# Patient Record
Sex: Male | Born: 1945 | Race: Black or African American | Hispanic: No | Marital: Married | Smoking: Never smoker
Health system: Southern US, Community
[De-identification: ages and names within clinical notes are randomized; demographics above are authoritative.]

## PROBLEM LIST (undated history)

## (undated) DIAGNOSIS — C801 Malignant (primary) neoplasm, unspecified: Secondary | ICD-10-CM

## (undated) DIAGNOSIS — R972 Elevated prostate specific antigen [PSA]: Secondary | ICD-10-CM

## (undated) DIAGNOSIS — E7800 Pure hypercholesterolemia, unspecified: Secondary | ICD-10-CM

## (undated) DIAGNOSIS — I10 Essential (primary) hypertension: Secondary | ICD-10-CM

## (undated) HISTORY — PX: PROSTATE BIOPSY: SHX241

---

## 1999-05-02 ENCOUNTER — Encounter: Admission: RE | Admit: 1999-05-02 | Discharge: 1999-05-02 | Payer: Self-pay | Admitting: *Deleted

## 1999-05-02 ENCOUNTER — Encounter: Payer: Self-pay | Admitting: *Deleted

## 1999-05-12 ENCOUNTER — Encounter (INDEPENDENT_AMBULATORY_CARE_PROVIDER_SITE_OTHER): Payer: Self-pay | Admitting: Specialist

## 1999-05-12 ENCOUNTER — Ambulatory Visit (HOSPITAL_COMMUNITY): Admission: RE | Admit: 1999-05-12 | Discharge: 1999-05-12 | Payer: Self-pay | Admitting: Gastroenterology

## 2001-10-14 ENCOUNTER — Ambulatory Visit (HOSPITAL_COMMUNITY): Admission: RE | Admit: 2001-10-14 | Discharge: 2001-10-14 | Payer: Self-pay | Admitting: Gastroenterology

## 2001-10-14 ENCOUNTER — Encounter (INDEPENDENT_AMBULATORY_CARE_PROVIDER_SITE_OTHER): Payer: Self-pay | Admitting: Specialist

## 2002-08-26 ENCOUNTER — Emergency Department (HOSPITAL_COMMUNITY): Admission: AD | Admit: 2002-08-26 | Discharge: 2002-08-26 | Payer: Self-pay | Admitting: Emergency Medicine

## 2004-02-01 ENCOUNTER — Encounter (INDEPENDENT_AMBULATORY_CARE_PROVIDER_SITE_OTHER): Payer: Self-pay | Admitting: *Deleted

## 2004-02-01 ENCOUNTER — Ambulatory Visit (HOSPITAL_COMMUNITY): Admission: RE | Admit: 2004-02-01 | Discharge: 2004-02-01 | Payer: Self-pay | Admitting: Gastroenterology

## 2006-12-14 ENCOUNTER — Emergency Department (HOSPITAL_COMMUNITY): Admission: EM | Admit: 2006-12-14 | Discharge: 2006-12-14 | Payer: Self-pay | Admitting: Emergency Medicine

## 2009-07-27 ENCOUNTER — Emergency Department (HOSPITAL_COMMUNITY): Admission: EM | Admit: 2009-07-27 | Discharge: 2009-07-27 | Payer: Self-pay | Admitting: Family Medicine

## 2010-06-02 NOTE — Op Note (Signed)
NAME:  Chase Cook, Chase Cook                         ACCOUNT NO.:  [id number]   MEDICAL RECORD NO.:  [id number]                   PATIENT TYPE:  AMB   LOCATION:  ENDO                                 FACILITY:  [facility]   PHYSICIAN:  Charna Elizabeth, M.D.                   DATE OF BIRTH:  04-29-1945   DATE OF PROCEDURE:  10/14/2001  DATE OF DISCHARGE:                                 OPERATIVE REPORT   PROCEDURE PERFORMED:  Colonoscopy with snare polypectomy times eight.   ENDOSCOPIST:  Charna Elizabeth, M.D.   INSTRUMENT USED:  Olympus video colonoscope.   INDICATIONS FOR PROCEDURE:  The patient is a 66 year old African-American  male undergoing colonoscopy for guaiac positive stool.  Rule out colonic  polyps, masses, hemorrhoids, etc.   PREPROCEDURE PREPARATION:  Informed consent was procured from the patient.  The patient was fasted for eight hours prior to the procedure and prepped  with a bottle of magnesium citrate and a gallon of NuLytely the night prior  to the procedure.   PREPROCEDURE PHYSICAL:  The patient had stable vital signs. Neck supple.  Chest clear to auscultation.  S1 and S2 regular.  Abdomen soft with normal  bowel sounds.   DESCRIPTION OF PROCEDURE:  The patient was placed in left lateral decubitus  position and sedated with 70 mg of Demerol and 7 mg of Versed intravenously.  Once the patient was adequately sedated and maintained on low flow oxygen  and continuous cardiac monitoring, the Olympus video colonoscope was  advanced from the rectum to the cecum without difficulty.  Multiple polyps  were seen in the colon, a 5 to 6 mm sessile polyp was snared from the  proximal right colon.  A 45 mm sessile polyp was snared from the  midtransverse colon.  An isolated nonbleeding AVM was seen in the proximal  transverse colon.  Six small sessile polyps were snared from the left colon.  Small internal hemorrhoids were seen on retroflexion.  There was no evidence  of  diverticulosis.   IMPRESSION:  1. Multiple colonic polyps.  See description above.  2. 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  3. Small nonbleeding internal hemorrhoid.  4. No evidence of diverticulosis.    RECOMMENDATIONS:  1. Avoid all nonsteroidals including aspirin for the next four weeks.  2. Await pathology results.  3. Outpatient follow-up in the next 7 to 10 days or earlier if need be.                                                   Charna Elizabeth, M.D.    JM/MEDQ  D:  10/14/2001  T:  10/14/2001  Job:  [id number]  cc:   Merlene Laughter. Renae Gloss, M.D.

## 2010-06-02 NOTE — Procedures (Signed)
[facility]  Patient:    Chase Cook, Chase Cook                      MRN: [id number] Proc. Date: 05/12/99 Adm. Date:  [id number] Disc. Date: [id number] Attending:  Deneen Harts CC:         Georg Ruddle. Viviann Spare, M.D.                           Procedure Report  PROCEDURE PERFORMED:  Colonoscopic polypectomy.  ENDOSCOPIST:  Griffith Citron, M.D.  INDICATIONS FOR PROCEDURE:  The patient is a 65 year old African-American male undergoing colonoscopy for polypectomy.  The patient examined with sigmoidoscopy April 22, 1998 at which time a colonoscopic polypectomy was advised.  The patient returns one year later to complete this recommendation.  He has had no interim difficulty.  DESCRIPTION OF PROCEDURE:  After the nature of the procedure was discussed with the patient including potential risks and complications and after discussing alternative methods of diagnosis and treatment, informed consent was signed.  The patient was premedicated, receiving IV sedation totalling Versed 6 mg, fentanyl 50 mcg administered in divided doses prior to the onset of the procedure.  Using the Olympus PCF-140L pediatric video colonoscope, rectum was intubated after normal digital examination.  No evidence of perianal or intrarectal pathology.  Normal smooth prostate.  The scope was easily advanced around the entire length of the colon to the cecum identified by the appendiceal orifice, ileocecal valve.  Preparation was good throughout.  The cecum was closely inspected and appeared normal.  In the proximal ascending  colon a diminutive 5 mm polyp was identified, resected with hot biopsy forceps recovered and submitted to pathology.  An additional 10 diminutive polyps were identified scattered around the entire length of the colon from ascending colon to the rectum.  Each benign, resected with hot biopsy forceps, recovered and submitted to pathology.  No additional  abnormalities were noted.  Specifically, without evidence of diverticular disease, mucosal inflammation, vascular lesion or other finding. Retroflex view in rectal vault did reveal internal hemorrhoids which were noninflamed.  The colon was decompressed, scope withdrawn.  The patient tolerated the procedure without difficulty being maintained on Datascope monitor and low-flow oxygen throughout.  Time:  2, technical 2, preparation 2, total score = 6.  ASSESSMENT: 1. Multiple colon polyps, all diminutive, benign-appearing.  All specimens    resected and submitted to pathology.  RECOMMENDATIONS: 1. Postpolypectomy instructions reviewed. 2. Follow-up pathology. 3. Annual hemoccult. 4. Repeat colonoscopy 3-year if adenomas, 5-year if hyperplastic.          DD:  05/15/99 TD:  05/16/99 Job: [id number]

## 2010-10-24 LAB — URINALYSIS, ROUTINE W REFLEX MICROSCOPIC
Bilirubin Urine: NEGATIVE
Glucose, UA: NEGATIVE
Ketones, ur: NEGATIVE
Nitrite: POSITIVE — AB
Protein, ur: 100 — AB
Specific Gravity, Urine: 1.025
Urobilinogen, UA: 1
pH: 5.5

## 2010-10-24 LAB — URINE MICROSCOPIC-ADD ON

## 2010-10-24 LAB — CBC
HCT: 38.7 — ABNORMAL LOW
Hemoglobin: 13
MCHC: 33.6
MCV: 85.9
Platelets: 173
RBC: 4.51
RDW: 13.1
WBC: 6.9

## 2010-10-24 LAB — I-STAT 8, (EC8 V) (CONVERTED LAB)
Acid-Base Excess: 2
BUN: 15
Bicarbonate: 27.7 — ABNORMAL HIGH
Chloride: 108
Glucose, Bld: 102 — ABNORMAL HIGH
HCT: 43
Hemoglobin: 14.6
Operator id: 294521
Potassium: 3.6
Sodium: 144
TCO2: 29
pCO2, Ven: 47.4
pH, Ven: 7.375 — ABNORMAL HIGH

## 2010-10-24 LAB — DIFFERENTIAL
Basophils Absolute: 0.1
Basophils Relative: 1
Eosinophils Absolute: 0.2
Eosinophils Relative: 3
Lymphocytes Relative: 19
Lymphs Abs: 1.3
Monocytes Absolute: 0.6
Monocytes Relative: 8
Neutro Abs: 4.8
Neutrophils Relative %: 69

## 2010-10-24 LAB — POCT I-STAT CREATININE
Creatinine, Ser: 1.1
Operator id: 294521

## 2010-10-24 LAB — URINE CULTURE: Colony Count: >=100000

## 2011-02-10 ENCOUNTER — Encounter (HOSPITAL_COMMUNITY): Payer: Self-pay

## 2011-02-10 ENCOUNTER — Emergency Department (INDEPENDENT_AMBULATORY_CARE_PROVIDER_SITE_OTHER)
Admission: EM | Admit: 2011-02-10 | Discharge: 2011-02-10 | Disposition: A | Discharge status: Home / Self Care | Payer: Self-pay | Source: Home / Self Care | Attending: Emergency Medicine | Admitting: Emergency Medicine

## 2011-02-10 DIAGNOSIS — S139XXA Sprain of joints and ligaments of unspecified parts of neck, initial encounter: Secondary | ICD-10-CM

## 2011-02-10 DIAGNOSIS — S161XXA Strain of muscle, fascia and tendon at neck level, initial encounter: Secondary | ICD-10-CM

## 2011-02-10 HISTORY — DX: Essential (primary) hypertension: I10

## 2011-02-10 MED ORDER — CYCLOBENZAPRINE HCL 5 MG PO TABS: 5.0000 mg | ORAL_TABLET | Freq: Three times a day (TID) | ORAL | Status: AC | PRN

## 2011-02-10 MED ORDER — DICLOFENAC SODIUM 75 MG PO TBEC: 75.0000 mg | DELAYED_RELEASE_TABLET | Freq: Two times a day (BID) | ORAL | Status: AC
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
## 2012-03-23 ENCOUNTER — Encounter (HOSPITAL_COMMUNITY): Payer: Self-pay | Admitting: Emergency Medicine

## 2012-03-23 ENCOUNTER — Emergency Department (INDEPENDENT_AMBULATORY_CARE_PROVIDER_SITE_OTHER)
Admission: EM | Admit: 2012-03-23 | Discharge: 2012-03-23 | Disposition: A | Discharge status: Home / Self Care | Payer: Medicare Other | Source: Home / Self Care

## 2012-03-23 DIAGNOSIS — N390 Urinary tract infection, site not specified: Secondary | ICD-10-CM

## 2012-03-23 LAB — POCT URINALYSIS DIP (DEVICE)
Glucose, UA: NEGATIVE mg/dL
Ketones, ur: NEGATIVE mg/dL
Nitrite: NEGATIVE
Protein, ur: 30 mg/dL — AB
Specific Gravity, Urine: 1.02 (ref 1.005–1.030)
Urobilinogen, UA: 1 mg/dL (ref 0.0–1.0)
pH: 5.5 (ref 5.0–8.0)

## 2012-03-23 MED ORDER — IBUPROFEN 800 MG PO TABS
800.0000 mg | ORAL_TABLET | Freq: Once | ORAL | Status: AC
  Administered 2012-03-23: 800 mg via ORAL

## 2012-03-23 MED ORDER — CEFTRIAXONE SODIUM 1 G IJ SOLR
1.0000 g | Freq: Once | INTRAMUSCULAR | Status: AC
  Administered 2012-03-23: 1 g via INTRAMUSCULAR

## 2012-03-23 MED ORDER — LIDOCAINE HCL (PF) 1 % IJ SOLN
INTRAMUSCULAR | Status: AC
  Filled 2012-03-23: qty 5

## 2012-03-23 MED ORDER — CEFTRIAXONE SODIUM 1 G IJ SOLR
INTRAMUSCULAR | Status: AC
  Filled 2012-03-23: qty 10

## 2012-03-23 MED ORDER — IBUPROFEN 800 MG PO TABS
ORAL_TABLET | ORAL | Status: AC
  Filled 2012-03-23: qty 1

## 2012-03-23 MED ORDER — LEVOFLOXACIN 250 MG PO TABS: 250.0000 mg | ORAL_TABLET | Freq: Every day | ORAL | Status: DC

## 2012-03-23 NOTE — ED Notes (Signed)
Pt has also stated that he has had a decrease in appetite and is very fatigue "I just want to sleep"

## 2012-03-23 NOTE — ED Notes (Signed)
Pt c/o fever since Thursday. Pt states temp has reached 105. Pt has been using tylenol for fever. C/o burning with urination when temp is elevated. Denies lower back pain and urinary symptoms.  Slight cough nonproductive.

## 2012-03-23 NOTE — ED Provider Notes (Signed)
History     CSN: [id number]  Arrival date & time 03/23/12  1337   First MD Initiated Contact with Patient 03/23/12 1344      Chief Complaint  Patient presents with  . Fever    fever since thursday. temp has reached 105    (Consider location/radiation/quality/duration/timing/severity/associated sxs/prior treatment) Patient is a 67 y.o. male presenting with fever.  Fever Associated symptoms: dysuria    This is a 67 year old male who presents for a complaint of fevers as high as 103 for about 4 days. He noticed some burning when he has to urinate but no pus or blood noted in the urine. No suprapubic pain or flank pain. The last time he had a urinary tract infection was about 3 or 4 years ago. He states that he has seen Dr. Brunilda Payor in the past and does not have any problems with his prostate. No complaints of nausea or vomiting.  Past Medical History  Diagnosis Date  . Hypertension     History reviewed. No pertinent past surgical history.  History reviewed. No pertinent family history.  History  Substance Use Topics  . Smoking status: Never Smoker   . Smokeless tobacco: Not on file  . Alcohol Use: No      Review of Systems  Constitutional: Positive for fever. Negative for appetite change.  HENT: Negative.   Eyes: Negative.   Respiratory: Negative.   Gastrointestinal: Negative.   Endocrine: Negative.   Genitourinary: Positive for dysuria, urgency, frequency and enuresis. Negative for hematuria, flank pain, decreased urine volume, discharge and penile pain.  Musculoskeletal: Negative.   Skin: Negative.   Neurological: Negative.   Hematological: Negative.   Psychiatric/Behavioral: Negative.     Allergies  Review of patient's allergies indicates no known allergies.  Home Medications   Current Outpatient Rx  Name  Route  Sig  Dispense  Refill  . amLODipine (NORVASC) 10 MG tablet   Oral   Take 10 mg by mouth daily.         Marland Kitchen levofloxacin (LEVAQUIN) 250 MG  tablet   Oral   Take 1 tablet (250 mg total) by mouth daily.   7 tablet   0     BP 148/88  Pulse 89  Temp(Src) 103 F (39.4 C) (Oral)  Resp 20  SpO2 96%  Physical Exam  ED Course  Procedures (including critical care time)  Labs Reviewed  POCT URINALYSIS DIP (DEVICE) - Abnormal; Notable for the following:    Bilirubin Urine SMALL (*)    Hgb urine dipstick MODERATE (*)    Protein, ur 30 (*)    Leukocytes, UA MODERATE (*)    All other components within normal limits  URINE CULTURE   No results found.   1. UTI (lower urinary tract infection)       MDM  The patient had a UTI in 2008 which was Escherichia coli and pan sensitive. I have ordered IM Rocephin to be given here followed by a 7 day course of Levaquin at 250 mg daily. He is advised to continue Tylenol or ibuprofen for fevers. If symptoms do not resolve by the end of the course of antibiotics he is advised to see Dr. Brunilda Payor in the office.        Calvert Cantor, MD 03/23/12 [phone]

## 2012-03-25 LAB — URINE CULTURE
Colony Count: >=100000
Special Requests: NORMAL

## 2012-03-25 NOTE — ED Notes (Signed)
Urine culture: >100,000 colonies E. Coli.  Pt. adequately treated with Levaquin. Vassie Moselle 03/25/2012

## 2014-05-20 LAB — HM COLONOSCOPY

## 2014-05-28 ENCOUNTER — Encounter: Payer: Self-pay | Admitting: Family Medicine

## 2014-06-30 ENCOUNTER — Emergency Department (HOSPITAL_COMMUNITY)
Admission: EM | Admit: 2014-06-30 | Discharge: 2014-07-01 | Disposition: A | Discharge status: Home / Self Care | Payer: Medicare Other | Attending: Emergency Medicine | Admitting: Emergency Medicine

## 2014-06-30 ENCOUNTER — Encounter (HOSPITAL_COMMUNITY): Payer: Self-pay | Admitting: Emergency Medicine

## 2014-06-30 DIAGNOSIS — R6883 Chills (without fever): Secondary | ICD-10-CM | POA: Insufficient documentation

## 2014-06-30 DIAGNOSIS — Z79899 Other long term (current) drug therapy: Secondary | ICD-10-CM | POA: Insufficient documentation

## 2014-06-30 DIAGNOSIS — N390 Urinary tract infection, site not specified: Secondary | ICD-10-CM | POA: Insufficient documentation

## 2014-06-30 DIAGNOSIS — I10 Essential (primary) hypertension: Secondary | ICD-10-CM | POA: Diagnosis not present

## 2014-06-30 DIAGNOSIS — R300 Dysuria: Secondary | ICD-10-CM | POA: Diagnosis present

## 2014-06-30 LAB — CBC WITH DIFFERENTIAL/PLATELET
Basophils Absolute: 0 10*3/uL (ref 0.0–0.1)
Basophils Relative: 0 % (ref 0–1)
Eosinophils Absolute: 0 10*3/uL (ref 0.0–0.7)
Eosinophils Relative: 0 % (ref 0–5)
HCT: 39 % (ref 39.0–52.0)
Hemoglobin: 13 g/dL (ref 13.0–17.0)
Lymphocytes Relative: 13 % (ref 12–46)
Lymphs Abs: 2.4 10*3/uL (ref 0.7–4.0)
MCH: 29.1 pg (ref 26.0–34.0)
MCHC: 33.3 g/dL (ref 30.0–36.0)
MCV: 87.2 fL (ref 78.0–100.0)
Monocytes Absolute: 1.9 10*3/uL — ABNORMAL HIGH (ref 0.1–1.0)
Monocytes Relative: 10 % (ref 3–12)
Neutro Abs: 13.8 10*3/uL — ABNORMAL HIGH (ref 1.7–7.7)
Neutrophils Relative %: 77 % (ref 43–77)
Platelets: 159 10*3/uL (ref 150–400)
RBC: 4.47 MIL/uL (ref 4.22–5.81)
RDW: 14.2 % (ref 11.5–15.5)
WBC: 18.1 10*3/uL — ABNORMAL HIGH (ref 4.0–10.5)

## 2014-06-30 LAB — URINALYSIS, ROUTINE W REFLEX MICROSCOPIC
Glucose, UA: NEGATIVE mg/dL
Ketones, ur: 15 mg/dL — AB
Nitrite: NEGATIVE
Protein, ur: 100 mg/dL — AB
Specific Gravity, Urine: 1.031 — ABNORMAL HIGH (ref 1.005–1.030)
Urobilinogen, UA: 1 mg/dL (ref 0.0–1.0)
pH: 5 (ref 5.0–8.0)

## 2014-06-30 LAB — COMPREHENSIVE METABOLIC PANEL
ALT: 20 U/L (ref 17–63)
AST: 20 U/L (ref 15–41)
Albumin: 3.6 g/dL (ref 3.5–5.0)
Alkaline Phosphatase: 60 U/L (ref 38–126)
Anion gap: 8 (ref 5–15)
BUN: 14 mg/dL (ref 6–20)
CO2: 25 mmol/L (ref 22–32)
Calcium: 8.6 mg/dL — ABNORMAL LOW (ref 8.9–10.3)
Chloride: 107 mmol/L (ref 101–111)
Creatinine, Ser: 1.22 mg/dL (ref 0.61–1.24)
GFR calc Af Amer: >60 mL/min (ref >60)
GFR calc non Af Amer: 59 mL/min — ABNORMAL LOW (ref >60)
Glucose, Bld: 109 mg/dL — ABNORMAL HIGH (ref 65–99)
Potassium: 3.3 mmol/L — ABNORMAL LOW (ref 3.5–5.1)
Sodium: 140 mmol/L (ref 135–145)
Total Bilirubin: 0.8 mg/dL (ref 0.3–1.2)
Total Protein: 6.7 g/dL (ref 6.5–8.1)

## 2014-06-30 LAB — URINE MICROSCOPIC-ADD ON

## 2014-06-30 MED ORDER — ACETAMINOPHEN 325 MG PO TABS
ORAL_TABLET | ORAL | Status: AC
  Filled 2014-06-30: qty 2

## 2014-06-30 MED ORDER — ACETAMINOPHEN 325 MG PO TABS
650.0000 mg | ORAL_TABLET | Freq: Once | ORAL | Status: AC
  Administered 2014-06-30: 650 mg via ORAL

## 2014-06-30 NOTE — ED Provider Notes (Signed)
CSN: [id number]     Arrival date & time 06/30/14  2052 History  This chart was scribed for Marisa Severin, MD by Annye Asa, ED Scribe. This patient was seen in room D34C/D34C and the patient's care was started at 12:07 AM.    Chief Complaint  Patient presents with  . Dysuria   The history is provided by the patient. No language interpreter was used.     HPI Comments: Chase Cook is a 69 y.o. male who presents to the Emergency Department complaining of 1 day of dysuria, chills, and weakness. Patient states he has prior experience with similar symptoms; last episode in 2008. He denies nausea, vomiting, diarrhea, back pain. He denies a personal history of kidney stones, prostate issues, or DM.  Past Medical History  Diagnosis Date  . Hypertension    History reviewed. No pertinent past surgical history. No family history on file. History  Substance Use Topics  . Smoking status: Never Smoker   . Smokeless tobacco: Not on file  . Alcohol Use: No    Review of Systems  Constitutional: Positive for chills.  Gastrointestinal: Negative for nausea, vomiting and diarrhea.  Genitourinary: Positive for dysuria.  Musculoskeletal: Negative for back pain.  All other systems reviewed and are negative.   Allergies  Review of patient's allergies indicates no known allergies.  Home Medications   Prior to Admission medications   Medication Sig Start Date End Date Taking? Authorizing Provider  amLODipine (NORVASC) 10 MG tablet Take 10 mg by mouth daily.    Historical Provider, MD  levofloxacin (LEVAQUIN) 250 MG tablet Take 1 tablet (250 mg total) by mouth daily. 03/23/12   Calvert Cantor, MD   BP 107/65 mmHg  Pulse 73  Temp(Src) 98.4 F (36.9 C) (Oral)  Resp 18  Ht  (1.676 m)  Wt 214 lb (97.07 kg)  BMI 34.56 kg/m2  SpO2 96% Physical Exam  Constitutional: He is oriented to person, place, and time. He appears well-developed and well-nourished. No distress.  HENT:  Head: Normocephalic  and atraumatic.  Eyes: EOM are normal. Pupils are equal, round, and reactive to light.  Neck: Normal range of motion. Neck supple. No JVD present.  Cardiovascular: Normal rate, regular rhythm and normal heart sounds.  Exam reveals no gallop and no friction rub.   No murmur heard. Pulmonary/Chest: Effort normal and breath sounds normal. No respiratory distress. He has no wheezes. He has no rales.  Abdominal: Soft. Bowel sounds are normal. He exhibits no mass. There is no tenderness. There is no rebound and no guarding.  Musculoskeletal: Normal range of motion. He exhibits no edema or tenderness.  No CVA tenderness  Neurological: He is alert and oriented to person, place, and time. He displays normal reflexes.  Skin: Skin is warm and dry. No rash noted.  Psychiatric: He has a normal mood and affect. His behavior is normal.  Nursing note and vitals reviewed.   ED Course  Procedures   DIAGNOSTIC STUDIES: Oxygen Saturation is 96% on RA, adequate by my interpretation.    COORDINATION OF CARE: 12:09 AM Discussed treatment plan with patient and family at bedside and patient agreed to plan.  Labs Review Labs Reviewed  CBC WITH DIFFERENTIAL/PLATELET - Abnormal; Notable for the following:    WBC 18.1 (*)    Neutro Abs 13.8 (*)    Monocytes Absolute 1.9 (*)    All other components within normal limits  COMPREHENSIVE METABOLIC PANEL - Abnormal; Notable for the following:  Potassium 3.3 (*)    Glucose, Bld 109 (*)    Calcium 8.6 (*)    GFR calc non Af Amer 59 (*)    All other components within normal limits  URINALYSIS, ROUTINE W REFLEX MICROSCOPIC (NOT AT [facility]) - Abnormal; Notable for the following:    Color, Urine RED (*)    APPearance TURBID (*)    Specific Gravity, Urine 1.031 (*)    Hgb urine dipstick LARGE (*)    Bilirubin Urine SMALL (*)    Ketones, ur 15 (*)    Protein, ur 100 (*)    Leukocytes, UA LARGE (*)    All other components within normal limits  URINE  MICROSCOPIC-ADD ON - Abnormal; Notable for the following:    Bacteria, UA MANY (*)    All other components within normal limits  URINE CULTURE    Imaging Review No results found.   EKG Interpretation None      MDM   Final diagnoses:  UTI (lower urinary tract infection)    I personally performed the services described in this documentation, which was scribed in my presence. The recorded information has been reviewed and is accurate.  69 year old male with one day of dysuria, low-grade fever, chills.  Has history of urinary tract infections.  Denies history of prostatitis, BPH, kidney stones or other urinary symptom dysfunction.  Patient denies any nausea or vomiting.  Leukocytosis noted, but patient not clinically ill-appearing Patient was given precautions for return.  Will start on antibiotics.  We'll send urine for culture.     Marisa Severin, MD 07/01/14 [phone]

## 2014-06-30 NOTE — ED Notes (Signed)
Pt. reports dysuria " it burns " with concentrated urine onset today , fever , denies injury or back pain .

## 2014-07-01 DIAGNOSIS — N390 Urinary tract infection, site not specified: Secondary | ICD-10-CM | POA: Diagnosis not present

## 2014-07-01 MED ORDER — CIPROFLOXACIN HCL 500 MG PO TABS
500.0000 mg | ORAL_TABLET | Freq: Once | ORAL | Status: AC
  Administered 2014-07-01: 500 mg via ORAL
  Filled 2014-07-01: qty 1

## 2014-07-01 MED ORDER — CIPROFLOXACIN HCL 500 MG PO TABS: 500.0000 mg | ORAL_TABLET | Freq: Two times a day (BID) | ORAL | Status: DC

## 2014-07-01 NOTE — ED Notes (Signed)
Provider bedside.

## 2014-07-01 NOTE — Discharge Instructions (Signed)
Return to the emergency department for persistent fevers, nausea, vomiting, change in mental status, or other new concerning symptoms.  Make sure that you take all antibiotics until they are gone.  Drink plenty of fluids.    Urinary Tract Infection Urinary tract infections (UTIs) can develop anywhere along your urinary tract. Your urinary tract is your body's drainage system for removing wastes and extra water. Your urinary tract includes two kidneys, two ureters, a bladder, and a urethra. Your kidneys are a pair of bean-shaped organs. Each kidney is about the size of your fist. They are located below your ribs, one on each side of your spine. CAUSES Infections are caused by microbes, which are microscopic organisms, including fungi, viruses, and bacteria. These organisms are so small that they can only be seen through a microscope. Bacteria are the microbes that most commonly cause UTIs. SYMPTOMS  Symptoms of UTIs may vary by age and gender of the patient and by the location of the infection. Symptoms in young women typically include a frequent and intense urge to urinate and a painful, burning feeling in the bladder or urethra during urination. Older women and men are more likely to be tired, shaky, and weak and have muscle aches and abdominal pain. A fever may mean the infection is in your kidneys. Other symptoms of a kidney infection include pain in your back or sides below the ribs, nausea, and vomiting. DIAGNOSIS To diagnose a UTI, your caregiver will ask you about your symptoms. Your caregiver also will ask to provide a urine sample. The urine sample will be tested for bacteria and white blood cells. White blood cells are made by your body to help fight infection. TREATMENT  Typically, UTIs can be treated with medication. Because most UTIs are caused by a bacterial infection, they usually can be treated with the use of antibiotics. The choice of antibiotic and length of treatment depend on your  symptoms and the type of bacteria causing your infection. HOME CARE INSTRUCTIONS  If you were prescribed antibiotics, take them exactly as your caregiver instructs you. Finish the medication even if you feel better after you have only taken some of the medication.  Drink enough water and fluids to keep your urine clear or pale yellow.  Avoid caffeine, tea, and carbonated beverages. They tend to irritate your bladder.  Empty your bladder often. Avoid holding urine for long periods of time.  Empty your bladder before and after sexual intercourse.  After a bowel movement, women should cleanse from front to back. Use each tissue only once. SEEK MEDICAL CARE IF:   You have back pain.  You develop a fever.  Your symptoms do not begin to resolve within 3 days. SEEK IMMEDIATE MEDICAL CARE IF:   You have severe back pain or lower abdominal pain.  You develop chills.  You have nausea or vomiting.  You have continued burning or discomfort with urination. MAKE SURE YOU:   Understand these instructions.  Will watch your condition.  Will get help right away if you are not doing well or get worse. Document Released: 10/11/2004 Document Revised: 07/03/2011 Document Reviewed: 02/09/2011 [facility] Patient Information 2015 [place], [place]. This information is not intended to replace advice given to you by your health care provider. Make sure you discuss any questions you have with your health care provider.

## 2014-07-01 NOTE — ED Notes (Addendum)
Pt reports that he experienced chills and weakness yesterday.  No n/v or diaharria.

## 2014-07-03 LAB — URINE CULTURE: Culture: >=100000

## 2014-07-05 ENCOUNTER — Telehealth (HOSPITAL_COMMUNITY): Payer: Self-pay

## 2014-07-05 NOTE — Telephone Encounter (Signed)
Post ED Visit - Positive Culture Follow-up  Culture report reviewed by antimicrobial stewardship pharmacist: []  Wes Dulaney, Pharm.D., BCPS []  Celedonio Miyamoto, [address].D., BCPS [x]  Georgina Pillion, Pharm.D., BCPS []  [place], [place].D., BCPS, AAHIVP []  Estella Husk, Pharm.D., BCPS, AAHIVP []  Elder Cyphers, [address].D., BCPS  Positive Urine culture, >/= 100,000 colonies -> Proteus Mirabilis Treated with Ciprofloxacin, organism sensitive to the same and no further patient follow-up is required at this time.Arvid Right 07/05/2014, 4:48 AM

## 2017-07-17 ENCOUNTER — Ambulatory Visit (HOSPITAL_COMMUNITY)
Admission: EM | Admit: 2017-07-17 | Discharge: 2017-07-17 | Disposition: A | Discharge status: Home / Self Care | Payer: Medicare Other | Attending: Family Medicine | Admitting: Family Medicine

## 2017-07-17 ENCOUNTER — Other Ambulatory Visit: Payer: Self-pay

## 2017-07-17 ENCOUNTER — Encounter (HOSPITAL_COMMUNITY): Payer: Self-pay | Admitting: Emergency Medicine

## 2017-07-17 DIAGNOSIS — R590 Localized enlarged lymph nodes: Secondary | ICD-10-CM

## 2017-07-17 HISTORY — DX: Pure hypercholesterolemia, unspecified: E78.00

## 2017-07-17 NOTE — ED Provider Notes (Signed)
[facility]    CSN: [id number] Arrival date & time: 07/17/17  1124     History   Chief Complaint Chief Complaint  Patient presents with  . Cyst    HPI Chase Cook is a 72 y.o. male with recent upper respiratory infection symptoms presenting with 2 days of discrete swelling under the right jaw. He had sore throat, and cough a couple weeks ago which has been resolving. No fever recently, and noted a swelling on the right neck 2 days ago in the afternoon that was mildly tender and has since decreased in size and is no longer tender. He denies any pain or difficulty with swallowing, but can feel it sometimes when swallowing. He's had no halitosis, no shortness of breath, stridor, neck stiffness. No other spots that he knows of and no medications directed at this.  HPI  Past Medical History:  Diagnosis Date  . High cholesterol   . Hypertension     There are no active problems to display for this patient.   History reviewed. No pertinent surgical history.     Home Medications    Prior to Admission medications   Medication Sig Start Date End Date Taking? Authorizing Provider  atorvastatin (LIPITOR) 10 MG tablet Take 10 mg by mouth daily.   Yes [provider]  potassium chloride (K-DUR) 10 MEQ tablet Take 10 mEq by mouth daily.   Yes [provider]  tamsulosin (FLOMAX) 0.4 MG CAPS capsule Take 0.4 mg by mouth.   Yes [provider]  triamterene-hydrochlorothiazide (DYAZIDE) 37.5-25 MG capsule Take 1 capsule by mouth daily.   Yes [provider]  amLODipine (NORVASC) 10 MG tablet Take 10 mg by mouth daily.    [provider]    Family History Family History  Problem Relation Age of Onset  . Diabetes Brother     Social History Social History   Tobacco Use  . Smoking status: Never Smoker  Substance Use Topics  . Alcohol use: No  . Drug use: No     Allergies   Patient has no known allergies.   Review  of Systems Review of Systems Per HPI. Denies fever, chills, weight loss, changes in vision or hearing, headache, current sore throat, chest pain, palpitations, shortness of breath, abdominal pain, nausea, vomiting, changes in bowel habits, blood in stool, change in bladder habits, myalgias, arthralgias, and rash.    Physical Exam Triage Vital Signs ED Triage Vitals  Enc Vitals Group     BP 07/17/17 1318 (!) 159/76     Pulse Rate 07/17/17 1318 68     Resp 07/17/17 1318 16     Temp 07/17/17 1318 97.9 F (36.6 C)     Temp Source 07/17/17 1318 Oral     SpO2 07/17/17 1318 99 %     Weight --      Height --      Head Circumference --      Peak Flow --      Pain Score 07/17/17 1252 0     Pain Loc --      Pain Edu? --      Excl. in [place]? --    No data found.  Updated Vital Signs BP (!) 159/76 (BP Location: Right Arm)   Pulse 68   Temp 97.9 F (36.6 C) (Oral)   Resp 16   SpO2 99%   Visual Acuity Right Eye Distance:   Left Eye Distance:   Bilateral Distance:  Right Eye Near:   Left Eye Near:    Bilateral Near:     Physical Exam BP (!) 159/76 (BP Location: Right Arm)   Pulse 68   Temp 97.9 F (36.6 C) (Oral)   Resp 16   SpO2 99%  Gen: Pleasant, well-appearing 71 y.o.male in NAD HEENT: MMM, posterior oropharynx clear without erythema or exudate. Gold crowns on bottom left molars. Neck: Oblong nontender ~1.5cm solid deep nodular structure palpable without visible erythema overlaying. No bruit to this area and no significant movement with swallowing. No surrounding nodules or contralateral lymphadenopathy. No occipital lymphadenopathy. Pulm: Non-labored; CTAB, no wheezes or stridor CV: Regular rate, no murmur appreciated; distal pulses intact/symmetric GI: + BS; soft, non-tender, non-distended Skin: No rashes, wounds, ulcers Neuro: A&Ox3, CN II-XII without deficits     UC Treatments / Results  Labs (all labs ordered are listed, but only abnormal results are  displayed) Labs Reviewed - No data to display  EKG None  Radiology No results found.  Procedures Procedures (including critical care time)  Medications Ordered in UC Medications - No data to display  Initial Impression / Assessment and Plan / UC Course  I have reviewed the triage vital signs and the nursing notes.  Pertinent labs & imaging results that were available during my care of the patient were reviewed by me and considered in my medical decision making (see chart for details).    72yo M with 2 days of modular growth consistent with reactive anterior cervical lymph node that is now decreasing in size, no longer tender. Recommended observation and return if still present in 2 weeks at which time further investigation would be warranted. DDx includes malignancy, zenker diverticulum, cyst, and abscess though these are felt much less likely.  Final Clinical Impressions(s) / UC Diagnoses   Final diagnoses:  Reactive cervical lymphadenopathy     Discharge Instructions     You were evaluated for what I believe is a lymph node that got bigger after an infection has resolved. It is expected to continue improving on its own without any other management. If it is still there in 2 weeks, seek medical attention for further work up. If it gets larger and you have difficulty swallowing or difficulty breathing, seek medical attention immediately.    ED Prescriptions    None     Controlled Substance Prescriptions [place] Controlled Substance Registry consulted? Not Applicable   Tyrone NineGrunz, Xavior Niazi B, MD 07/17/17 1401

## 2017-07-17 NOTE — ED Notes (Signed)
Patient access reported a visitor approached desk asking about patient and if had been called.  Patient was called multiple times.  Patient has been un-discharged and returned to patient list

## 2017-07-17 NOTE — ED Notes (Signed)
Called pt x 2

## 2017-07-17 NOTE — ED Triage Notes (Signed)
Knot under right side of chin since Monday.  Initially seemed more sore than now.  Patient has a slight cough.  Initially felt like he had a "Bransyn horse " in the area, pain eased, but knot remained.  Feels this with swallowing

## 2017-07-17 NOTE — Discharge Instructions (Signed)
You were evaluated for what I believe is a lymph node that got bigger after an infection has resolved. It is expected to continue improving on its own without any other management. If it is still there in 2 weeks, seek medical attention for further work up. If it gets larger and you have difficulty swallowing or difficulty breathing, seek medical attention immediately.

## 2018-08-28 ENCOUNTER — Ambulatory Visit (HOSPITAL_COMMUNITY)
Admission: EM | Admit: 2018-08-28 | Discharge: 2018-08-28 | Disposition: A | Discharge status: Home / Self Care | Payer: Medicare Other | Attending: Family Medicine | Admitting: Family Medicine

## 2018-08-28 ENCOUNTER — Encounter (HOSPITAL_COMMUNITY): Payer: Self-pay | Admitting: Emergency Medicine

## 2018-08-28 ENCOUNTER — Other Ambulatory Visit: Payer: Self-pay

## 2018-08-28 DIAGNOSIS — R6884 Jaw pain: Secondary | ICD-10-CM

## 2018-08-28 DIAGNOSIS — K047 Periapical abscess without sinus: Secondary | ICD-10-CM | POA: Diagnosis not present

## 2018-08-28 DIAGNOSIS — R590 Localized enlarged lymph nodes: Secondary | ICD-10-CM

## 2018-08-28 MED ORDER — PENICILLIN V POTASSIUM 500 MG PO TABS: 500.0000 mg | ORAL_TABLET | Freq: Four times a day (QID) | ORAL | 0 refills | Status: AC

## 2018-08-28 NOTE — ED Triage Notes (Signed)
Pt here for right sided jaw pain and swelling x 2 days

## 2018-08-28 NOTE — ED Provider Notes (Signed)
[facility]    CSN: [id number] Arrival date & time: 08/28/18  1003      History   Chief Complaint Chief Complaint  Patient presents with  . Jaw Pain    HPI Su LeyCharlie R Kimmer is a 73 y.o. male.   HPI  Patient states he has right-sided jaw pain and swelling for 2 days.  He states that he does not have anything wrong with his teeth.  He states that he has a swollen gland on the side.  He states that it is mildly tender.  He states that this is swelled up before when he had sore throat.  No fever or chills.  No pain with chewing.  No known dental fracture or problem.  Past Medical History:  Diagnosis Date  . High cholesterol   . Hypertension     There are no active problems to display for this patient.   History reviewed. No pertinent surgical history.     Home Medications    Prior to Admission medications   Medication Sig Start Date End Date Taking? Authorizing Provider  amLODipine (NORVASC) 10 MG tablet Take 10 mg by mouth daily.    [provider]  atorvastatin (LIPITOR) 10 MG tablet Take 10 mg by mouth daily.    [provider]  penicillin v potassium (VEETID) 500 MG tablet Take 1 tablet (500 mg total) by mouth 4 (four) times daily for 10 days. 08/28/18 09/07/18  Eustace MooreNelson, Erlinda Solinger Sue, MD  potassium chloride (K-DUR) 10 MEQ tablet Take 10 mEq by mouth daily.    [provider]  tamsulosin (FLOMAX) 0.4 MG CAPS capsule Take 0.4 mg by mouth.    [provider]  triamterene-hydrochlorothiazide (DYAZIDE) 37.5-25 MG capsule Take 1 capsule by mouth daily.    [provider]    Family History Family History  Problem Relation Age of Onset  . Diabetes Brother     Social History Social History   Tobacco Use  . Smoking status: Never Smoker  Substance Use Topics  . Alcohol use: No  . Drug use: No     Allergies   Patient has no known allergies.    Review of Systems Review of Systems  Constitutional: Negative for  chills and fever.  HENT: Positive for dental problem. Negative for ear pain and sore throat.   Eyes: Negative for pain and visual disturbance.  Respiratory: Negative for cough and shortness of breath.   Cardiovascular: Negative for chest pain and palpitations.  Gastrointestinal: Negative for abdominal pain and vomiting.  Genitourinary: Negative for dysuria and hematuria.  Musculoskeletal: Negative for arthralgias and back pain.  Skin: Negative for color change and rash.  Neurological: Negative for seizures and syncope.  Hematological: Positive for adenopathy.  All other systems reviewed and are negative.    Physical Exam Triage Vital Signs ED Triage Vitals  Enc Vitals Group     BP 08/28/18 1103 (!) 177/95     Pulse Rate 08/28/18 1103 79     Resp 08/28/18 1103 18     Temp 08/28/18 1103 98.1 F (36.7 C)     Temp Source 08/28/18 1103 Oral     SpO2 08/28/18 1103 98 %     Weight --      Height --      Head Circumference --      Peak Flow --      Pain Score 08/28/18 1104 5     Pain Loc --      Pain  Edu? --      Excl. in [place]? --    No data found.  Updated Vital Signs BP (!) 177/95 (BP Location: Right Arm)   Pulse 79   Temp 98.1 F (36.7 C) (Oral)   Resp 18   SpO2 98%      Physical Exam Constitutional:      General: He is not in acute distress.    Appearance: He is well-developed. He is obese.     Comments: Patient's no distress.  Pleasant cooperative  HENT:     Head: Normocephalic and atraumatic.      Right Ear: Tympanic membrane and ear canal normal.     Left Ear: Tympanic membrane normal.     Nose: Nose normal.     Mouth/Throat:     Mouth: Mucous membranes are moist.     Pharynx: No posterior oropharyngeal erythema.   Eyes:     Conjunctiva/sclera: Conjunctivae normal.     Pupils: Pupils are equal, round, and reactive to light.  Neck:     Musculoskeletal: Normal range of motion.  Cardiovascular:     Rate and Rhythm: Normal rate.  Pulmonary:     Effort:  Pulmonary effort is normal. No respiratory distress.  Abdominal:     General: There is no distension.     Palpations: Abdomen is soft.  Musculoskeletal: Normal range of motion.  Skin:    General: Skin is warm and dry.  Neurological:     Mental Status: He is alert.      UC Treatments / Results  Labs (all labs ordered are listed, but only abnormal results are displayed) Labs Reviewed - No data to display  EKG   Radiology No results found.  Procedures Procedures (including critical care time)  Medications Ordered in UC Medications - No data to display  Initial Impression / Assessment and Plan / UC Course  I have reviewed the triage vital signs and the nursing notes.  Pertinent labs & imaging results that were available during my care of the patient were reviewed by me and considered in my medical decision making (see chart for details).    Patient does have swelling of the gums adjacent to his molar that looks like a dental abscess.  This would explain swelling of the face, some reactive lymph nodes.  I am concerned that there was a large node described in the same area last year, and if this proves persistent it would require additional attention and possibly biopsy.  Patient is advised to follow-up with his PCP regarding the lymph node Final Clinical Impressions(s) / UC Diagnoses   Final diagnoses:  Dental infection  Anterior cervical adenopathy  Pain in lower jaw     Discharge Instructions     Take the antibiotic as directed Warm compresses to area Expect improvement over a few days See your PCP for persistent pain or swelling   ED Prescriptions    Medication Sig Dispense Auth. Provider   penicillin v potassium (VEETID) 500 MG tablet Take 1 tablet (500 mg total) by mouth 4 (four) times daily for 10 days. 40 tablet Raylene Everts, MD     Controlled Substance Prescriptions [place] Controlled Substance Registry consulted? Not Applicable   Raylene Everts,  MD 08/28/18 [phone]

## 2018-08-28 NOTE — Discharge Instructions (Addendum)
Take the antibiotic as directed Warm compresses to area Expect improvement over a few days See your PCP for persistent pain or swelling

## 2018-10-08 ENCOUNTER — Other Ambulatory Visit: Payer: Self-pay

## 2018-10-08 DIAGNOSIS — Z20822 Contact with and (suspected) exposure to covid-19: Secondary | ICD-10-CM

## 2018-10-10 LAB — NOVEL CORONAVIRUS, NAA: SARS-CoV-2, NAA: NOT DETECTED

## 2019-03-01 ENCOUNTER — Ambulatory Visit: Payer: Medicare Other

## 2019-05-19 ENCOUNTER — Encounter (HOSPITAL_COMMUNITY): Payer: Self-pay | Admitting: Emergency Medicine

## 2019-05-19 ENCOUNTER — Emergency Department (HOSPITAL_COMMUNITY)
Admission: EM | Admit: 2019-05-19 | Discharge: 2019-05-20 | Disposition: A | Discharge status: Home / Self Care | Payer: Medicare Other | Attending: Emergency Medicine | Admitting: Emergency Medicine

## 2019-05-19 ENCOUNTER — Other Ambulatory Visit: Payer: Self-pay

## 2019-05-19 DIAGNOSIS — Z7984 Long term (current) use of oral hypoglycemic drugs: Secondary | ICD-10-CM | POA: Diagnosis not present

## 2019-05-19 DIAGNOSIS — I10 Essential (primary) hypertension: Secondary | ICD-10-CM | POA: Diagnosis not present

## 2019-05-19 DIAGNOSIS — Y92003 Bedroom of unspecified non-institutional (private) residence as the place of occurrence of the external cause: Secondary | ICD-10-CM | POA: Insufficient documentation

## 2019-05-19 DIAGNOSIS — S50861A Insect bite (nonvenomous) of right forearm, initial encounter: Secondary | ICD-10-CM | POA: Diagnosis present

## 2019-05-19 DIAGNOSIS — Y939 Activity, unspecified: Secondary | ICD-10-CM | POA: Diagnosis not present

## 2019-05-19 DIAGNOSIS — W57XXXA Bitten or stung by nonvenomous insect and other nonvenomous arthropods, initial encounter: Secondary | ICD-10-CM | POA: Diagnosis not present

## 2019-05-19 DIAGNOSIS — E119 Type 2 diabetes mellitus without complications: Secondary | ICD-10-CM | POA: Insufficient documentation

## 2019-05-19 DIAGNOSIS — Z79899 Other long term (current) drug therapy: Secondary | ICD-10-CM | POA: Diagnosis not present

## 2019-05-19 DIAGNOSIS — Y999 Unspecified external cause status: Secondary | ICD-10-CM | POA: Diagnosis not present

## 2019-05-19 NOTE — ED Triage Notes (Signed)
Patient reports itchy tick bite at right lower axilla this evening . Denies fever/respirations unlabored.

## 2019-05-20 NOTE — ED Provider Notes (Signed)
[facility] EMERGENCY DEPARTMENT Provider Note   CSN: [id number] Arrival date & time: 05/19/19  2334     History Chief Complaint  Patient presents with  . Tick Bite    Chase Cook is a 74 y.o. male.  HPI     This is a 74 year old male with history of hypertension and hypercholesterolemia who presents with a tick bite.  Patient reports he noted going to bed tonight and itchy bump under his right arm.  His wife looked and there was a tick.  He has known a friend that died of [place] spotted fever so this concerned him.  He reports that the tick was removed in triage.  He has not had any fevers or noted any rashes.  Unsure of how long the tick has been there.  Besides being itchy it was not painful.  Past Medical History:  Diagnosis Date  . High cholesterol   . Hypertension     There are no problems to display for this patient.   History reviewed. No pertinent surgical history.     Family History  Problem Relation Age of Onset  . Diabetes Brother     Social History   Tobacco Use  . Smoking status: Never Smoker  . Smokeless tobacco: Never Used  Substance Use Topics  . Alcohol use: No  . Drug use: No    Home Medications Prior to Admission medications   Medication Sig Start Date End Date Taking? Authorizing Provider  amLODipine (NORVASC) 10 MG tablet Take 10 mg by mouth daily.    [provider]  atorvastatin (LIPITOR) 10 MG tablet Take 10 mg by mouth daily.    [provider]  potassium chloride (K-DUR) 10 MEQ tablet Take 10 mEq by mouth daily.    [provider]  tamsulosin (FLOMAX) 0.4 MG CAPS capsule Take 0.4 mg by mouth.    [provider]  triamterene-hydrochlorothiazide (DYAZIDE) 37.5-25 MG capsule Take 1 capsule by mouth daily.    [provider]    Allergies    Patient has no known allergies.  Review of Systems   Review of Systems  Constitutional: Negative for fever.  Skin:  Negative for rash.       Tick bite  All other systems reviewed and are negative.   Physical Exam Updated Vital Signs BP (!) 159/87 (BP Location: Left Arm)   Pulse 63   Temp 98.6 F (37 C) (Oral)   Resp 18   Ht 1.676 m (5\' 6" )   Wt (!) 155 kg   SpO2 99%   BMI 55.15 kg/m   Physical Exam Vitals and nursing note reviewed.  Constitutional:      Appearance: He is well-developed. He is obese. He is not ill-appearing.  HENT:     Head: Normocephalic and atraumatic.     Nose: Nose normal.     Mouth/Throat:     Mouth: Mucous membranes are moist.  Eyes:     Pupils: Pupils are equal, round, and reactive to light.  Cardiovascular:     Rate and Rhythm: Normal rate and regular rhythm.  Pulmonary:     Effort: Pulmonary effort is normal. No respiratory distress.  Skin:    General: Skin is warm and dry.     Comments: Punctate area just below the right axilla with slight erythema noted, there is a black center concerning for retained tick, no adjacent erythema or fluctuance noted  Neurological:     Mental Status: He  is alert and oriented to person, place, and time.  Psychiatric:        Mood and Affect: Mood normal.     ED Results / Procedures / Treatments   Labs (all labs ordered are listed, but only abnormal results are displayed) Labs Reviewed - No data to display  EKG None  Radiology No results found.  Procedures Procedures (including critical care time)  Medications Ordered in ED Medications - No data to display  ED Course  I have reviewed the triage vital signs and the nursing notes.  Pertinent labs & imaging results that were available during my care of the patient were reviewed by me and considered in my medical decision making (see chart for details).    MDM Rules/Calculators/A&P                       Patient presents with concern for tick bite.  Tick was removed in triage although on my exam, there was some concern for the head of the tick still being within  the bite.  I did remove a small black fleck with tweezers.  Otherwise there is no systemic symptoms or rash.  No indication for prophylaxis.  I did discuss with the patient that if he develops any new or worsening symptoms including rash or fever he should be reevaluated for tickborne illness.  After history, exam, and medical workup I feel the patient has been appropriately medically screened and is safe for discharge home. Pertinent diagnoses were discussed with the patient. Patient was given return precautions.   Final Clinical Impression(s) / ED Diagnoses Final diagnoses:  Tick bite, initial encounter    Rx / DC Orders ED Discharge Orders    None       Johnnisha Forton, Barbette Hair, MD 05/20/19 [phone]

## 2019-05-20 NOTE — Discharge Instructions (Addendum)
You were seen today and a tick was removed.  Monitor of the removal site.  If you notice any rash, skin changes, or fever you should be reevaluated.

## 2019-09-18 ENCOUNTER — Other Ambulatory Visit: Payer: Medicare Other

## 2019-09-18 ENCOUNTER — Other Ambulatory Visit: Payer: Self-pay

## 2019-09-18 DIAGNOSIS — Z20822 Contact with and (suspected) exposure to covid-19: Secondary | ICD-10-CM

## 2019-09-19 LAB — NOVEL CORONAVIRUS, NAA: SARS-CoV-2, NAA: NOT DETECTED

## 2019-10-26 ENCOUNTER — Ambulatory Visit: Payer: Medicare Other | Attending: Internal Medicine

## 2019-10-26 DIAGNOSIS — Z23 Encounter for immunization: Secondary | ICD-10-CM

## 2019-10-26 NOTE — Progress Notes (Signed)
   Covid-19 Vaccination Clinic  Name:  Chase Cook    MRN: [id number] DOB: [birth year]  10/26/2019  Chase Cook was observed post Covid-19 immunization for 15 minutes without incident. He was provided with Vaccine Information Sheet and instruction to access the V-Safe system.   Chase Cook was instructed to call [phone] with any severe reactions post vaccine: Marland Kitchen Difficulty breathing  . Swelling of face and throat  . A fast heartbeat  . A bad rash all over body  . Dizziness and weakness

## 2020-07-16 ENCOUNTER — Encounter (HOSPITAL_COMMUNITY): Payer: Self-pay

## 2020-07-16 ENCOUNTER — Ambulatory Visit (HOSPITAL_COMMUNITY)
Admission: EM | Admit: 2020-07-16 | Discharge: 2020-07-16 | Disposition: A | Discharge status: Home / Self Care | Payer: Medicare Other | Attending: Family Medicine | Admitting: Family Medicine

## 2020-07-16 ENCOUNTER — Other Ambulatory Visit: Payer: Self-pay

## 2020-07-16 DIAGNOSIS — M5441 Lumbago with sciatica, right side: Secondary | ICD-10-CM | POA: Diagnosis not present

## 2020-07-16 DIAGNOSIS — M5442 Lumbago with sciatica, left side: Secondary | ICD-10-CM

## 2020-07-16 MED ORDER — PREDNISONE 10 MG (21) PO TBPK: ORAL_TABLET | Freq: Every day | ORAL | 0 refills | Status: AC

## 2020-07-16 MED ORDER — TIZANIDINE HCL 4 MG PO TABS: 4.0000 mg | ORAL_TABLET | Freq: Four times a day (QID) | ORAL | 0 refills | Status: DC | PRN

## 2020-07-16 NOTE — ED Provider Notes (Signed)
[facility]   [id number] 07/16/20 Arrival Time: 1047  EL:FYBOF PAIN  SUBJECTIVE: History from: patient. Chase Cook is a 75 y.o. male complains of right leg pain that began 7 days ago.  Denies a precipitating event or specific injury. Reports that he thinks that he pulled a muscled and states that he feels like the leg is "giving out" at times.  Describes the pain as constant and achy in character. Has tried OTC medications without relief. Symptoms are made worse with activity. Denies similar symptoms in the past. Denies fever, chills, erythema, ecchymosis, effusion, weakness, numbness and tingling, saddle paresthesias, loss of bowel or bladder function.      ROS: As per HPI.  All other pertinent ROS negative.     Past Medical History:  Diagnosis Date   High cholesterol    Hypertension    History reviewed. No pertinent surgical history. No Known Allergies No current facility-administered medications on file prior to encounter.   Current Outpatient Medications on File Prior to Encounter  Medication Sig Dispense Refill   amLODipine (NORVASC) 10 MG tablet Take 10 mg by mouth daily.     atorvastatin (LIPITOR) 10 MG tablet Take 10 mg by mouth daily.     potassium chloride (K-DUR) 10 MEQ tablet Take 10 mEq by mouth daily.     tamsulosin (FLOMAX) 0.4 MG CAPS capsule Take 0.4 mg by mouth.     triamterene-hydrochlorothiazide (DYAZIDE) 37.5-25 MG capsule Take 1 capsule by mouth daily.     Social History   Socioeconomic History   Marital status: Single    Spouse name: Not on file   Number of children: Not on file   Years of education: Not on file   Highest education level: Not on file  Occupational History   Not on file  Tobacco Use   Smoking status: Never   Smokeless tobacco: Never  Substance and Sexual Activity   Alcohol use: No   Drug use: No   Sexual activity: Not on file  Other Topics Concern   Not on file  Social History Narrative   Not on file   Social  Determinants of Health   Financial Resource Strain: Not on file  Food Insecurity: Not on file  Transportation Needs: Not on file  Physical Activity: Not on file  Stress: Not on file  Social Connections: Not on file  Intimate Partner Violence: Not on file   Family History  Problem Relation Age of Onset   Diabetes Brother     OBJECTIVE:  Vitals:   07/16/20 1220 07/16/20 1223  BP: (!) 165/83   Pulse: 63   Resp: 20   Temp:  98.1 F (36.7 C)  TempSrc:  Oral  SpO2: 99%     General appearance: ALERT; in no acute distress.  Head: NCAT Lungs: Normal respiratory effort CV: pulses 2+ bilaterally. Cap refill < 2 seconds Musculoskeletal:  Inspection: Skin warm, dry, clear and intact No erythema, effusion noted Palpation: low back tender to palpation ROM: Limited ROM active and passive with bending, twisting, changing positions Skin: warm and dry Neurologic: Ambulates without difficulty; Sensation intact about the upper/ lower extremities Psychological: alert and cooperative; normal mood and affect  DIAGNOSTIC STUDIES:  No results found.   ASSESSMENT & PLAN:  1. Acute bilateral low back pain with bilateral sciatica    Meds ordered this encounter  Medications   predniSONE (STERAPRED UNI-PAK 21 TAB) 10 MG (21) TBPK tablet    Sig: Take by mouth daily for  6 days. Take 6 tablets on day 1, 5 tablets on day 2, 4 tablets on day 3, 3 tablets on day 4, 2 tablets on day 5, 1 tablet on day 6    Dispense:  21 tablet    Refill:  0    Order Specific Question:   Supervising Provider    Answer:   Merrilee Jansky [[id number]]   tiZANidine (ZANAFLEX) 4 MG tablet    Sig: Take 1 tablet (4 mg total) by mouth every 6 (six) hours as needed for muscle spasms.    Dispense:  30 tablet    Refill:  0    Order Specific Question:   Supervising Provider    Answer:   Merrilee Jansky [id number]   Steroid course prescribed Tizanidine prescribed prn Sedation precautions given Continue conservative  management of rest, ice, and gentle stretches Take ibuprofen as needed for pain relief (may cause abdominal discomfort, ulcers, and GI bleeds avoid taking with other NSAIDs) Follow up with PCP if symptoms persist Return or go to the ER if you have any new or worsening symptoms (fever, chills, chest pain, abdominal pain, changes in bowel or bladder habits, pain radiating into lower legs)   Reviewed expectations re: course of current medical issues. Questions answered. Outlined signs and symptoms indicating need for more acute intervention. Patient verbalized understanding. After Visit Summary given.        Moshe Cipro, NP 07/16/20 1248

## 2020-07-16 NOTE — Discharge Instructions (Addendum)
I have sent in a prednisone taper for you to take for 6 days. 6 tablets on day one, 5 tablets on day two, 4 tablets on day three, 3 tablets on day four, 2 tablets on day five, and 1 tablet on day six.  I have sent in tizanidine for you to take up to 3 times per day as needed for muscle spasms  Follow up with this office or with primary care if symptoms are persisting.  Follow up in the ER for high fever, trouble swallowing, trouble breathing, other concerning symptoms.

## 2020-07-16 NOTE — ED Triage Notes (Signed)
Pt presents with right leg pain x 1 week.   States he might have pulled a muscle and states he feel the right leg is giving away.

## 2020-07-25 ENCOUNTER — Other Ambulatory Visit: Payer: Self-pay | Admitting: Sports Medicine

## 2020-07-25 ENCOUNTER — Ambulatory Visit
Admission: RE | Admit: 2020-07-25 | Discharge: 2020-07-25 | Disposition: A | Discharge status: Home / Self Care | Payer: Medicare Other | Source: Ambulatory Visit | Attending: Sports Medicine | Admitting: Sports Medicine

## 2020-07-25 ENCOUNTER — Other Ambulatory Visit: Payer: Self-pay

## 2020-07-25 DIAGNOSIS — R52 Pain, unspecified: Secondary | ICD-10-CM

## 2022-07-28 IMAGING — CR DG LUMBAR SPINE 2-3V
3 series · 3 of 3 positions shown · non-contrast
Comparison: None.

CLINICAL DATA: Pain.

EXAM:
LUMBAR SPINE - 2-3 VIEW

[w lumbar spine ap]
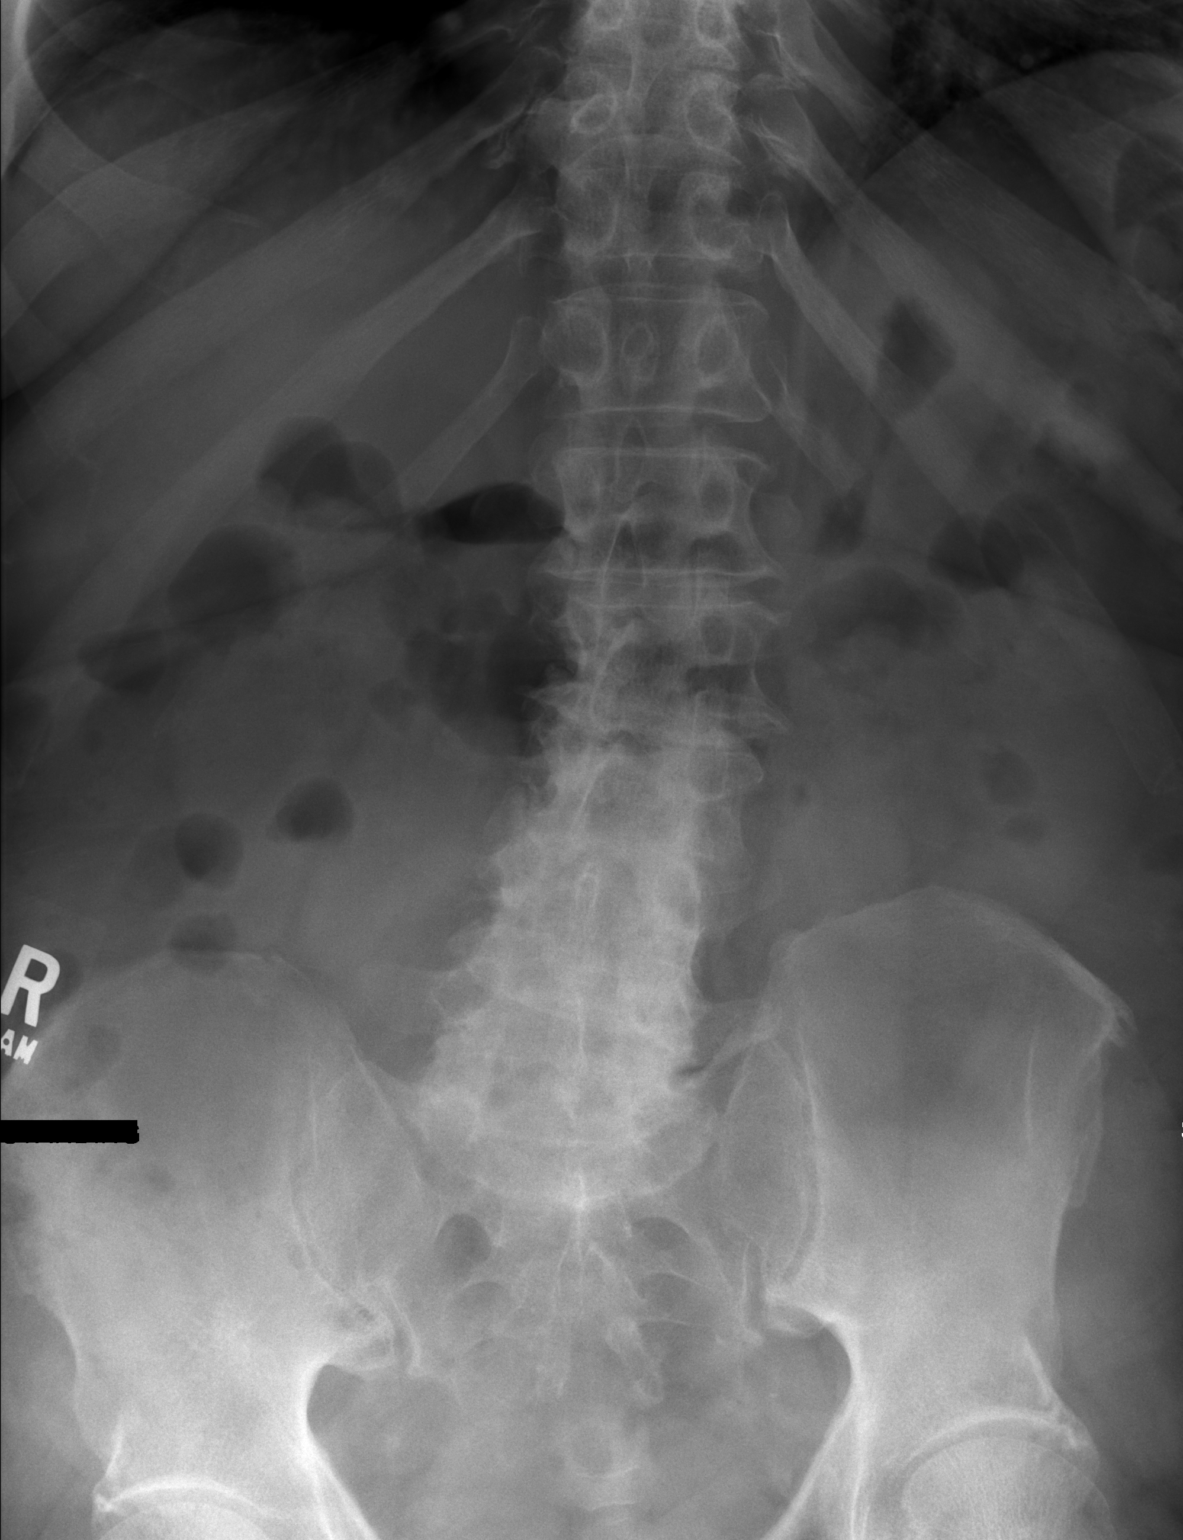

[w lumbar spine lat]
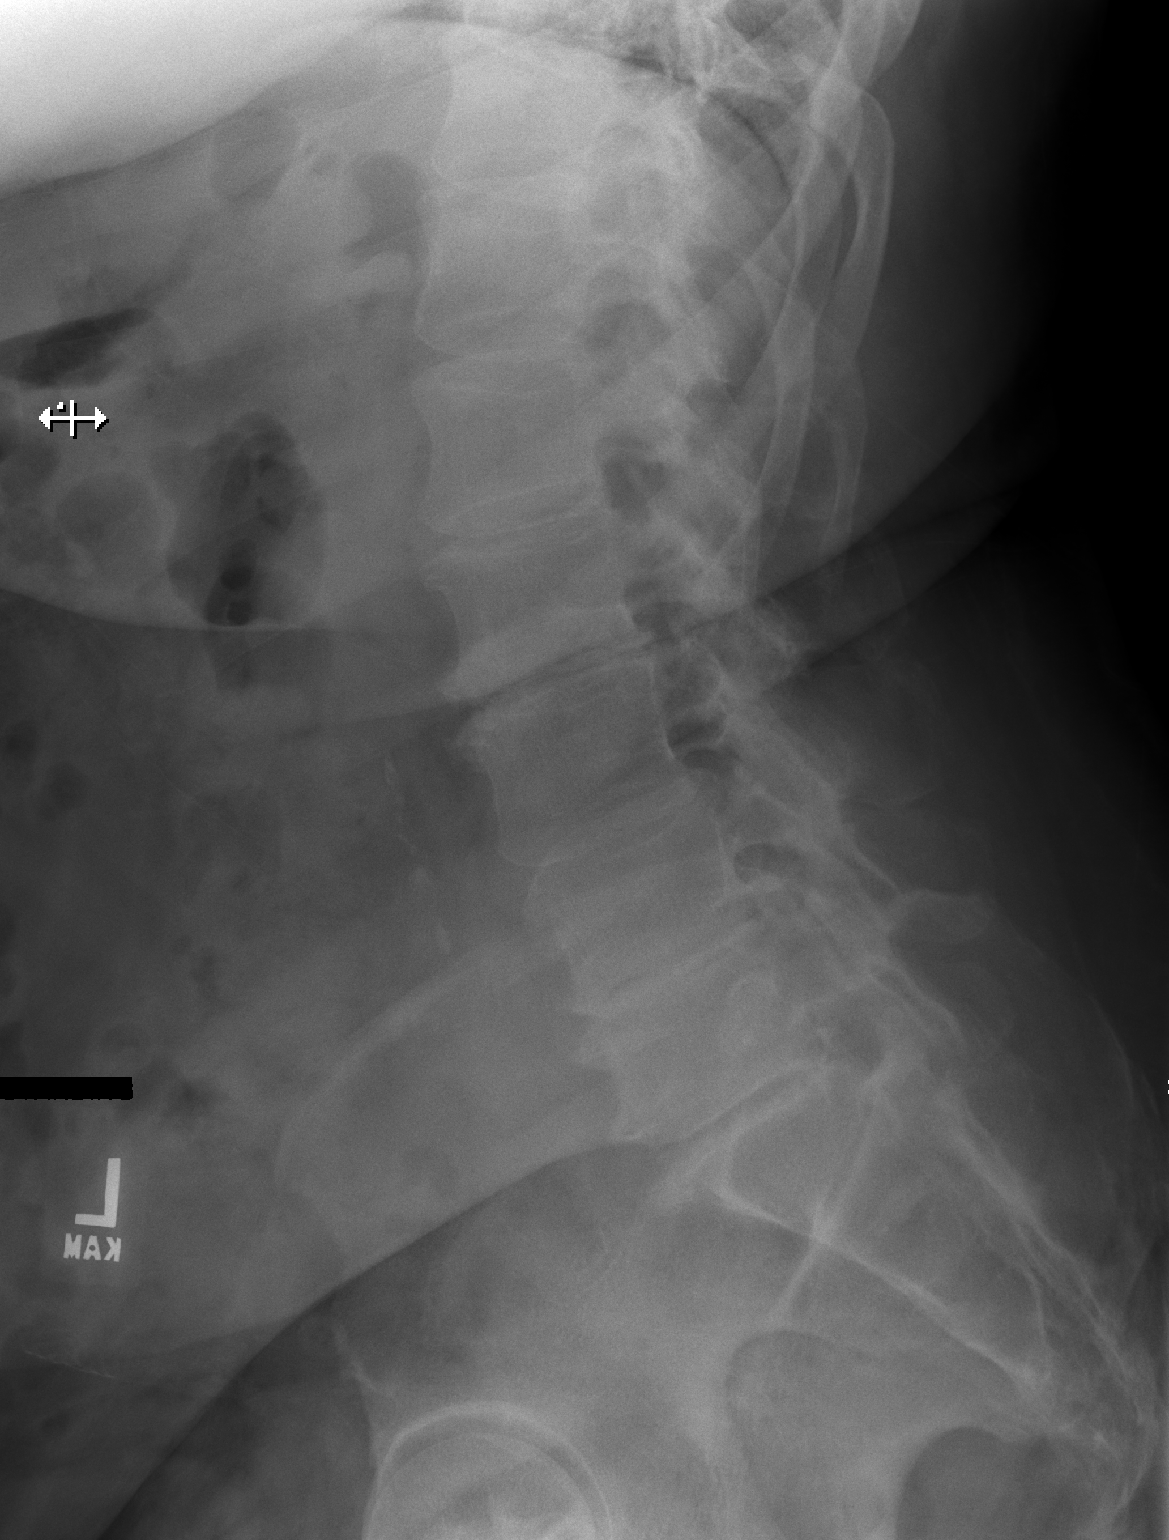

[w lumbar l-5 s-1 spot]
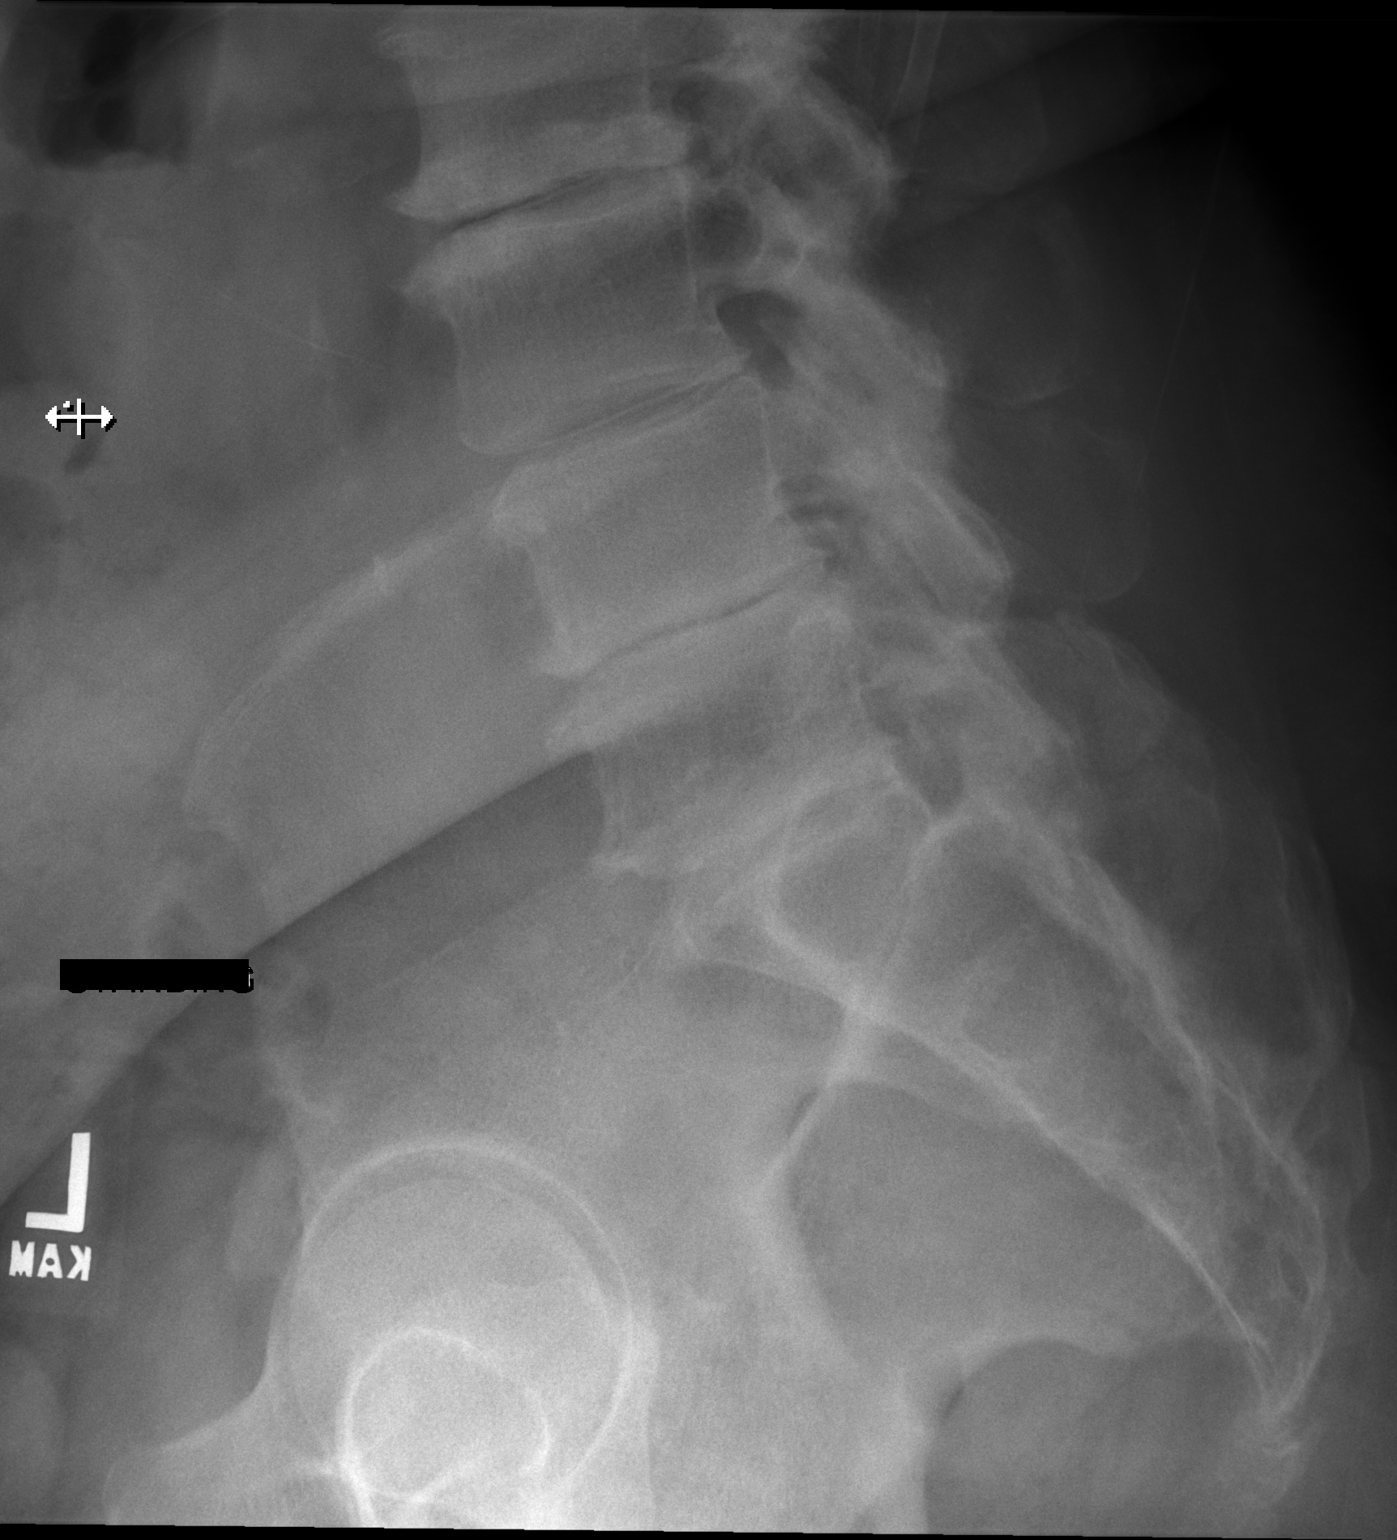

[3 of 3 positions shown; findings below may reference images not displayed]

FINDINGS: Mild curvature of the lumbar spine appears convex towards the left.
The vertebral body heights are well preserved. There is moderate
multilevel disc space narrowing and endplate spurring compatible
with degenerative disc disease. This is most advanced at L2-3 and
L5-S1. No fracture or dislocation identified.
IMPRESSION: 1. No acute findings.
2. Lumbar degenerative disc disease.

## 2023-01-04 ENCOUNTER — Other Ambulatory Visit (HOSPITAL_COMMUNITY): Payer: Self-pay | Admitting: Urology

## 2023-01-04 DIAGNOSIS — R972 Elevated prostate specific antigen [PSA]: Secondary | ICD-10-CM

## 2023-01-15 ENCOUNTER — Ambulatory Visit (HOSPITAL_COMMUNITY)
Admission: RE | Admit: 2023-01-15 | Discharge: 2023-01-15 | Disposition: A | Discharge status: Home / Self Care | Payer: Medicare Other | Source: Ambulatory Visit | Attending: Urology | Admitting: Urology

## 2023-01-15 DIAGNOSIS — R972 Elevated prostate specific antigen [PSA]: Secondary | ICD-10-CM | POA: Diagnosis present

## 2023-01-15 MED ORDER — GADOBUTROL 1 MMOL/ML IV SOLN
10.0000 mL | Freq: Once | INTRAVENOUS | Status: AC | PRN
  Administered 2023-01-15: 10 mL via INTRAVENOUS

## 2023-02-04 ENCOUNTER — Encounter (HOSPITAL_COMMUNITY)
Admission: RE | Admit: 2023-02-04 | Discharge: 2023-02-04 | Disposition: A | Discharge status: Home / Self Care | Payer: No Typology Code available for payment source | Source: Ambulatory Visit | Attending: Urology | Admitting: Urology

## 2023-02-04 ENCOUNTER — Other Ambulatory Visit (HOSPITAL_COMMUNITY): Payer: Self-pay | Admitting: Urology

## 2023-02-04 DIAGNOSIS — C61 Malignant neoplasm of prostate: Secondary | ICD-10-CM

## 2023-02-04 MED ORDER — FLOTUFOLASTAT F 18 GALLIUM 296-5846 MBQ/ML IV SOLN
8.0000 | Freq: Once | INTRAVENOUS | Status: AC
  Administered 2023-02-04: 8.6 via INTRAVENOUS

## 2023-02-18 NOTE — Progress Notes (Signed)
GU Location of Tumor / Histology: Prostate Ca  If Prostate Cancer, Gleason Score is (4 + 5) and PSA is (18.70 on 01/07/2023)  Chase Cook presented as referral from Dr. Vilma Prader ([facility])  Biopsies     02/04/2023 Dr. Vilma Prader NM PET (PSMA) Skull to Mid Thgih CLINICAL DATA: Prostate carcinoma. High risk staging.   IMPRESSION: 1. Intense radiotracer activity within the prostate gland consistent primary prostate adenocarcinoma. 2. Metastatic lymph node posterior RIGHT of the rectum as described above. 3. No evidence of metastatic adenopathy outside the pelvis. 4. No visceral metastasis or skeletal metastasis.   01/15/2023 Dr. Vilma Prader MR Prostate with/without Contrast CLINICAL DATA: Elevated PSA level. R97.20   IMPRESSION: 1. Large PI-RADS category 5 lesion of the peripheral zone. Targeting data sent to [facility]. 2. Prostatomegaly and benign prostatic hypertrophy. 3. 0.7 cm right perirectal lymph node, mildly prominent for location.  Past/Anticipated interventions by urology, if any: NA  Past/Anticipated interventions by medical oncology, if any: NA  Weight changes, if any: No  IPSS:  12 SHIM:  11  Bowel/Bladder complaints, if any:  Urinary frequency.  No bowel issues.  Nausea/Vomiting, if any:  No  Pain issues, if any:  No  SAFETY ISSUES: Prior radiation? No Pacemaker/ICD? No Possible current pregnancy? Male Is the patient on methotrexate? No  Current Complaints / other details:

## 2023-02-26 ENCOUNTER — Ambulatory Visit
Admission: RE | Admit: 2023-02-26 | Discharge: 2023-02-26 | Disposition: A | Discharge status: Home / Self Care | Payer: No Typology Code available for payment source | Source: Ambulatory Visit | Attending: Radiation Oncology | Admitting: Radiation Oncology

## 2023-02-26 ENCOUNTER — Encounter: Payer: Self-pay | Admitting: Radiation Oncology

## 2023-02-26 VITALS — BP 169/88 | HR 65 | Temp 97.5°F | Resp 20 | Ht 66.0 in | Wt 252.0 lb

## 2023-02-26 DIAGNOSIS — I10 Essential (primary) hypertension: Secondary | ICD-10-CM | POA: Diagnosis not present

## 2023-02-26 DIAGNOSIS — C775 Secondary and unspecified malignant neoplasm of intrapelvic lymph nodes: Secondary | ICD-10-CM | POA: Insufficient documentation

## 2023-02-26 DIAGNOSIS — Z79899 Other long term (current) drug therapy: Secondary | ICD-10-CM | POA: Diagnosis not present

## 2023-02-26 DIAGNOSIS — E7800 Pure hypercholesterolemia, unspecified: Secondary | ICD-10-CM | POA: Insufficient documentation

## 2023-02-26 DIAGNOSIS — C61 Malignant neoplasm of prostate: Secondary | ICD-10-CM | POA: Diagnosis present

## 2023-02-26 HISTORY — DX: Elevated prostate specific antigen (PSA): R97.20

## 2023-02-26 NOTE — Progress Notes (Signed)
Introduced myself to the patient, and his wife, as the prostate nurse navigator.  No barriers to care identified at this time.  He is here to discuss his radiation treatment options. Patient will proceed with ADT (Received on 2/10) and 8 weeks of daily radiation. I gave him my business card and asked him to call me with questions or concerns.  Verbalized understanding.

## 2023-02-26 NOTE — Progress Notes (Signed)
Radiation Oncology         [phone] ________________________________  Initial Outpatient Consultation  Name: Chase Cook MRN: [id number]  Date: 02/26/2023  DOB: [birth year]  WU:[id number], Rosalita Chessman, MD  Jennette Bill Scherrie Merritts, MD   REFERRING PHYSICIAN: Adonis Brook, MD  DIAGNOSIS: 78 y.o. 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 Malignant neoplasm of prostate (HCC)  C61     2. 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 male with a diagnosis of prostate cancer. He has a longstanding history of BPH with LUTS and elevated PSA, previously followed by Dr. Benancio Deeds and managed with Flomax and Vesicare.  Recently, he was noted to have an elevated PSA by his physician at the [facility]. He underwent a prostate MRI there in December 2024 that showed a PI-RADS 5 lesion.  Accordingly, he was referred to Dr. Jennette Bill on 01/04/23 for MRI fusion biopsy. A repeat PSA obtained that day confirmed significant elevation at 18.7. The patient underwent repeat prostate MRI on 01/15/23 in preparation for an MRI fusion  biopsy. This again showed a PI-RADS 5 lesion within the peripheral zone, as well as a mildly prominent 0.7 cm right perirectal lymph node. The patient proceeded to MRI fusion biopsy of the prostate on 01/30/23.  The prostate volume measured 101 cc.  Out of 15 core biopsies, 14 were positive.  The maximum Gleason score was 4+5, and this was seen in all three cores from the MRI ROI as well as in the left base lateral, right base, and right mid. Additionally, Gleason 4+4 was seen in the remaining right-sided cores, left apex, and left mid, and Gleason 4+3 was seen in the left apex lateral and left base.  He underwent staging PSMA PET scan on 02/04/23 showing, in  addition to activity within the prostate, a solitary metastatic perirectal lymph node, posterior right of rectum.   The patient reviewed the biopsy and imaging results with his urologist and he has kindly been referred today for discussion of potential radiation treatment options. He was started on ADT with Firmagon on 02/25/23.   PREVIOUS RADIATION THERAPY: No  PAST MEDICAL HISTORY:  Past Medical History:  Diagnosis Date   Elevated PSA    High cholesterol    Hypertension       PAST SURGICAL HISTORY: Past Surgical History:  Procedure Laterality Date   PROSTATE BIOPSY      FAMILY HISTORY:  Family History  Problem Relation Age of Onset   Diabetes Brother     SOCIAL HISTORY:  Social History   Socioeconomic History   Marital status: Single    Spouse name: Not on file   Number of children: Not on file   Years of education: Not on file   Highest education level: Not on file  Occupational History   Not on file  Tobacco Use   Smoking status: Never   Smokeless tobacco: Never  Vaping Use   Vaping status: Never Used  Substance and Sexual Activity   Alcohol use: No   Drug use: No   Sexual activity: Not Currently  Other Topics Concern   Not on file  Social History Narrative   Not on file   Social Drivers of Health  Financial Resource Strain: Not on file  Food Insecurity: No Food Insecurity (02/26/2023)   Hunger Vital Sign    Worried About Running Out of Food in the Last Year: Never true    Ran Out of Food in the Last Year: Never true  Transportation Needs: No Transportation Needs (02/26/2023)   PRAPARE - Administrator, Civil Service (Medical): No    Lack of Transportation (Non-Medical): No  Physical Activity: Not on file  Stress: Not on file  Social Connections: Not on file  Intimate Partner Violence: Not At Risk (02/26/2023)   Humiliation, Afraid, Rape, and Kick questionnaire    Fear of Current or Ex-Partner: No    Emotionally Abused: No     Physically Abused: No    Sexually Abused: No    ALLERGIES: Lactose intolerance (gi)  MEDICATIONS:  Current Outpatient Medications  Medication Sig Dispense Refill   amLODipine (NORVASC) 10 MG tablet Take 10 mg by mouth daily.     atorvastatin (LIPITOR) 10 MG tablet Take 10 mg by mouth daily.     betamethasone dipropionate (DIPROLENE) 0.05 % ointment Apply topically 2 (two) times daily.     cholecalciferol (VITAMIN D3) 25 MCG (1000 UNIT) tablet Take 1,000 Units by mouth daily.     clotrimazole (LOTRIMIN) 1 % cream Apply 1 Application topically as needed.     oxybutynin (DITROPAN) 5 MG tablet Take 5 mg by mouth 3 (three) times daily.     tamsulosin (FLOMAX) 0.4 MG CAPS capsule Take 0.4 mg by mouth.     valsartan (DIOVAN) 160 MG tablet Take 160 mg by mouth daily. Take one half tablet for blood pressure.     No current facility-administered medications for this encounter.    REVIEW OF SYSTEMS:  On review of systems, the patient reports that he is doing well overall. He denies any chest pain, shortness of breath, cough, fevers, chills, night sweats, unintended weight changes. He denies any bowel disturbances, and denies abdominal pain, nausea or vomiting. He denies any new musculoskeletal or joint aches or pains. His IPSS was 12, indicating moderate urinary symptoms despite taking Flomax and Vesicare daily as prescribed. His SHIM was 11, indicating he has moderate erectile dysfunction. A complete review of systems is obtained and is otherwise negative.    PHYSICAL EXAM:  Wt Readings from Last 3 Encounters:  02/26/23 252 lb (114.3 kg)  05/19/19 (!) 341 lb 11.4 oz (155 kg)  06/30/14 214 lb (97.1 kg)   Temp Readings from Last 3 Encounters:  02/26/23 (!) 97.5 F (36.4 C)  07/16/20 98.1 F (36.7 C) (Oral)  05/19/19 98.6 F (37 C) (Oral)   BP Readings from Last 3 Encounters:  02/26/23 (!) 169/88  07/16/20 (!) 165/83  05/19/19 (!) 159/87   Pulse Readings from Last 3 Encounters:   02/26/23 65  07/16/20 63  05/19/19 63   Pain Assessment Pain Score: 0-No pain/10  In general this is a well appearing African-American male in no acute distress. He's alert and oriented x4 and appropriate throughout the examination. Cardiopulmonary assessment is negative for acute distress, and he exhibits normal effort.     KPS = 100  100 - Normal; no complaints; no evidence of disease. 90   - Able to carry on normal activity; minor signs or symptoms of disease. 80   - Normal activity with effort; some signs or symptoms of disease. 38   - Cares for self; unable to carry on normal activity or to do active work.  60   - Requires occasional assistance, but is able to care for most of his personal needs. 50   - Requires considerable assistance and frequent medical care. 40   - Disabled; requires special care and assistance. 30   - Severely disabled; hospital admission is indicated although death not imminent. 20   - Very sick; hospital admission necessary; active supportive treatment necessary. 10   - Moribund; fatal processes progressing rapidly. 0     - Dead  Karnofsky DA, Abelmann WH, Craver LS and Burchenal JH [phone]) The use of the nitrogen mustards in the palliative treatment of carcinoma: with particular reference to bronchogenic carcinoma Cancer 1 634-56  LABORATORY DATA:  Lab Results  Component Value Date   WBC 18.1 (H) 06/30/2014   HGB 13.0 06/30/2014   HCT 39.0 06/30/2014   MCV 87.2 06/30/2014   PLT 159 06/30/2014   Lab Results  Component Value Date   NA 140 06/30/2014   K 3.3 (L) 06/30/2014   CL 107 06/30/2014   CO2 25 06/30/2014   Lab Results  Component Value Date   ALT 20 06/30/2014   AST 20 06/30/2014   ALKPHOS 60 06/30/2014   BILITOT 0.8 06/30/2014     RADIOGRAPHY: NM PET (PSMA) SKULL TO MID THIGH Result Date: 02/06/2023 CLINICAL DATA:  Prostate carcinoma.  High risk staging. EXAM: NUCLEAR MEDICINE PET SKULL BASE TO THIGH TECHNIQUE: 8.6 mCi  Flotufolastat (Posluma) was injected intravenously. Full-ring PET imaging was performed from the skull base to thigh after the radiotracer. CT data was obtained and used for attenuation correction and anatomic localization. COMPARISON:  Prostate MRI 01/15/2023 FINDINGS: NECK No radiotracer activity in neck lymph nodes. Incidental CT finding: None. CHEST No radiotracer accumulation within mediastinal or hilar lymph nodes. No suspicious pulmonary nodules on the CT scan. Incidental CT finding: None. ABDOMEN/PELVIS Prostate: Intense radiotracer activity the in the posterior RIGHT and central apical prostate lobe with SUV max equal 36.7. Lymph nodes: Radiotracer avid lymph node in the mesorectal sheath posterior RIGHT of the rectum measuring 8 mm on image 168 with SUV max equal 11.7. Liver: No evidence of liver metastasis. Incidental CT finding: None. SKELETON No focal activity to suggest skeletal metastasis. IMPRESSION: 1. Intense radiotracer activity within the prostate gland consistent primary prostate adenocarcinoma. 2. Metastatic lymph node posterior RIGHT of the rectum as described above. 3. No evidence of metastatic adenopathy outside the pelvis. 4. No visceral metastasis or skeletal metastasis. Electronically Signed   By: Genevive Bi M.D.   On: 02/06/2023 16:38      IMPRESSION/PLAN: 1. 78 y.o. gentleman with locally advanced adenocarcinoma of the prostate with Gleason Score of 4+5, PSA of 18.7 and a single pelvic nodal metastasis. We discussed the patient's workup and outlined the nature of prostate cancer in this setting. The patient's T stage, Gleason's score, and PSA put him into the high risk group and his staging PSMA PET scan confirms locally advanced disease with a solitary pelvic nodal metastasis. Accordingly, he is eligible for a variety of potential treatment options including prostatectomy or LT-ADT concurrent with either 8 weeks of external radiation or 5 weeks of external radiation with an  upfront brachytherapy boost. We discussed the available radiation techniques, and focused on the details and logistics of delivery. The patient is not a candidate for brachytherapy boost with a prostate volume of 101 cc.  Therefore, we discussed and outlined the risks, benefits, short and long-term effects associated with daily external beam radiotherapy and compared and contrasted these  with prostatectomy. We discussed the role of SpaceOAR gel in reducing the rectal toxicity associated with radiotherapy.  We also detailed the role of ADT in the treatment of high risk prostate cancer and discussed that he may also benefit from therapy escalation with ARPI since he has nodal involvement.  He was encouraged to ask questions that were answered to his stated satisfaction.  At the conclusion of our conversation, the patient is interested in moving forward with 8 weeks of external beam therapy concurrent with LT-ADT. He has already started ADT with Firmagon on 02/25/2023 so, we will share our discussion with Dr. Jennette Bill and make arrangements for fiducial markers and SpaceOAR gel placement in early April 2025, prior to simulation, to reduce rectal toxicity from radiotherapy.  We also recommend that he meet with Dr. Cherly Hensen, in medical oncology, to discuss the potential role for therapy escalation with ARPI. The patient appears to have a good understanding of his disease and our treatment recommendations which are of curative intent and is in agreement with the stated plan.  Therefore, we will move forward with treatment planning accordingly, in anticipation of beginning IMRT approximately 2 months after starting ADT. We enjoyed meeting him and his wife today and look forward to continuing to participate in his care.  We personally spent 70 minutes in this encounter including chart review, reviewing radiological studies, meeting face-to-face with the patient, entering orders and completing documentation.    Marguarite Arbour, PA-C    Margaretmary Dys, MD  [facility]  Radiation Oncology Direct Dial: [phone]  Fax: [phone] [email or web address]  Skype  LinkedIn   This document serves as a record of services personally performed by Margaretmary Dys, MD and Marcello Fennel, PA-C. It was created on their behalf by Mickie Bail, a trained medical scribe. The creation of this record is based on the scribe's personal observations and the provider's statements to them. This document has been checked and approved by the attending provider.

## 2023-03-07 ENCOUNTER — Other Ambulatory Visit: Payer: Self-pay | Admitting: Urology

## 2023-03-19 NOTE — Assessment & Plan Note (Signed)
 Will start Zytiga 1000 mg with prednisone 5 mg daily for total of 2 years as tolerated ADT for 2 years Follow-up with radiation oncology team as recommended Baseline CBC, CMP, PSA and testosterone CMP every 2 weeks for 22-months with follow-up

## 2023-03-19 NOTE — Progress Notes (Unsigned)
 [facility] CONSULT NOTE  Patient Care Team: Benjiman Core, MD as PCP - General (Internal Medicine) Cherlyn Cushing, RN as Oncology Nurse Navigator  ASSESSMENT & PLAN:  Chase Cook is a 78 y.o.male with history of hypertension, hyperlipidemia being seen at [facility]cology [facility] for prostate cancer.  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   Germline testing: not yet Somatic testing: not yet Treatment:  8 weeks of external beam therapy concurrent with LT-ADT   The patient was counseled on the natural history of prostate cancer and the standard treatment options that are available for prostate cancer.  Discussed Stampede trials showed survival benefit of adding abiraterone with prednisone to ADT in patients with high risk nonmetastatic and node positive disease.  Reports 6-year metastatic free survival was 82% with abiraterone for 2 years versus 69%. (ESMO 2021)  N1 disease is represented.  We discussed options either ADT plus radiation, or adding abiraterone with prednisone in addition to both.  Side effect of abiraterone plus prednisone can be hypertension, hot flush, joint pain, decreased potassium, fatigue, increased liver enzymes, constipation, edema, hyperglycemia, anemia, muscle pain or weakness, osteoporosis, infection. Severe liver toxicity and death has been reported. Rare arrhythmia and heart failure have been reported.  After discussion, Treyon would like to proceed.     No problem-specific Assessment & Plan notes found for this encounter.   No orders of the defined types were placed in this encounter.   Supportive baseline bone mineral density study and then every 2 years calcium (1000-1200 mg daily from food and supplements) and vitamin D3 (1000 IU daily) Zometa (5 mg IV annually) for osteopenia (T-score between -1.0 and -2.5) on ADT after dental clearance. If  CRPC, 4 mg every 3 months if having bone metastases. Control and prevent diabetes Aggressive cardiovascular risk management Weight-bearing exercises (30 minutes per day) Limit alcohol consumption and avoid smoking  The total time spent in the appointment was {CHL ONC TIME VISIT - UXLKG:[id number]} encounter with patients including review of chart and various tests results, discussions about plan of care and coordination of care plan  All questions were answered. The patient knows to call the clinic with any problems, questions or concerns. No barriers to learning was detected.  Melven Sartorius, MD 3/4/20255:29 PM  CHIEF COMPLAINTS/PURPOSE OF CONSULTATION:  Prostate cancer  HISTORY OF PRESENTING ILLNESS:  Chase Cook 78 y.o. male is here because of prostate cancer. I have reviewed his chart and materials related to his cancer extensively and collaborated history with the patient. Summary of oncologic history is as follows: Oncology History  Malignant neoplasm of prostate (HCC)  02/26/2023 Initial Diagnosis   Malignant neoplasm of prostate (HCC)   02/26/2023 Cancer Staging   Staging form: Prostate, AJCC 8th Edition - Clinical stage from 02/26/2023: Stage IVA (cT1c, cN1, cM0, PSA: 18.7, Grade Group: 5) - Signed by Margaretmary Dys, MD on 02/26/2023 Histopathologic type: Adenocarcinoma, NOS Stage prefix: Initial diagnosis Prostate specific antigen (PSA) range: 10 to 19 Gleason primary pattern: 4 Gleason secondary pattern: 5 Gleason score: 9 Histologic grading system: 5 grade system     MEDICAL HISTORY:  Past Medical History:  Diagnosis Date   Elevated PSA    High cholesterol    Hypertension     SURGICAL HISTORY: Past Surgical History:  Procedure Laterality Date   PROSTATE BIOPSY      SOCIAL HISTORY: Social History   Socioeconomic History  Marital status: Single    Spouse name: Not on file   Number of children: Not on file   Years of education: Not on file    Highest education level: Not on file  Occupational History   Not on file  Tobacco Use   Smoking status: Never   Smokeless tobacco: Never  Vaping Use   Vaping status: Never Used  Substance and Sexual Activity   Alcohol use: No   Drug use: No   Sexual activity: Not Currently  Other Topics Concern   Not on file  Social History Narrative   Not on file   Social Drivers of Health   Financial Resource Strain: Not on file  Food Insecurity: No Food Insecurity (02/26/2023)   Hunger Vital Sign    Worried About Running Out of Food in the Last Year: Never true    Ran Out of Food in the Last Year: Never true  Transportation Needs: No Transportation Needs (02/26/2023)   PRAPARE - Administrator, Civil Service (Medical): No    Lack of Transportation (Non-Medical): No  Physical Activity: Not on file  Stress: Not on file  Social Connections: Not on file  Intimate Partner Violence: Not At Risk (02/26/2023)   Humiliation, Afraid, Rape, and Kick questionnaire    Fear of Current or Ex-Partner: No    Emotionally Abused: No    Physically Abused: No    Sexually Abused: No    FAMILY HISTORY: Family History  Problem Relation Age of Onset   Diabetes Brother     ALLERGIES:  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  MEDICATIONS:  Current Outpatient Medications  Medication Sig Dispense Refill   amLODipine (NORVASC) 10 MG tablet Take 10 mg by mouth daily.     atorvastatin (LIPITOR) 10 MG tablet Take 10 mg by mouth daily.     betamethasone dipropionate (DIPROLENE) 0.05 % ointment Apply topically 2 (two) times daily.     cholecalciferol (VITAMIN D3) 25 MCG (1000 UNIT) tablet Take 1,000 Units by mouth daily.     clotrimazole (LOTRIMIN) 1 % cream Apply 1 Application topically as needed.     oxybutynin (DITROPAN) 5 MG tablet Take 5 mg by mouth 3 (three) times daily.     tamsulosin (FLOMAX) 0.4 MG CAPS capsule Take 0.4 mg by mouth.     valsartan (DIOVAN) 160 MG tablet Take 160 mg by mouth  daily. Take one half tablet for blood pressure.     No current facility-administered medications for this visit.    REVIEW OF SYSTEMS:   All relevant systems were reviewed with the patient and are negative.  PHYSICAL EXAMINATION: ECOG PERFORMANCE STATUS: {CHL ONC ECOG [phone]}  There were no vitals filed for this visit. There were no vitals filed for this visit.  GENERAL: alert, no distress and comfortable SKIN: skin color is normal, no jaundice, rashes EYES: sclera clear OROPHARYNX: no exudate, no erythema NECK: supple LYMPH:  no palpable lymphadenopathy in the cervical, axillary regions LUNGS: Effort normal, no respiratory distress.  Clear to auscultation bilaterally HEART: regular rate & rhythm and no lower extremity edema ABDOMEN: soft, non-tender and nondistended Musculoskeletal: no point tenderness NEURO: no focal motor/sensory deficits  LABORATORY DATA:  I have reviewed the data as listed Lab Results  Component Value Date   WBC 18.1 (H) 06/30/2014   HGB 13.0 06/30/2014   HCT 39.0 06/30/2014   MCV 87.2 06/30/2014   PLT 159 06/30/2014   No results for input(s): "NA", "K", "CL", "CO2", "  GLUCOSE", "BUN", "CREATININE", "CALCIUM", "GFRNONAA", "GFRAA", "PROT", "ALBUMIN", "AST", "ALT", "ALKPHOS", "BILITOT", "BILIDIR", "IBILI" in the last 8760 hours.  RADIOGRAPHIC STUDIES: I have personally reviewed the radiological images as listed and agreed with the findings in the report. No results found.

## 2023-03-21 ENCOUNTER — Inpatient Hospital Stay: Payer: No Typology Code available for payment source

## 2023-03-21 ENCOUNTER — Telehealth: Payer: Self-pay | Admitting: Pharmacy Technician

## 2023-03-21 ENCOUNTER — Telehealth: Payer: Self-pay

## 2023-03-21 ENCOUNTER — Other Ambulatory Visit (HOSPITAL_COMMUNITY): Payer: Self-pay

## 2023-03-21 VITALS — BP 160/79 | HR 83 | Temp 97.5°F | Resp 17 | Wt 251.1 lb

## 2023-03-21 DIAGNOSIS — E7800 Pure hypercholesterolemia, unspecified: Secondary | ICD-10-CM | POA: Diagnosis not present

## 2023-03-21 DIAGNOSIS — Z79899 Other long term (current) drug therapy: Secondary | ICD-10-CM | POA: Diagnosis not present

## 2023-03-21 DIAGNOSIS — Z9189 Other specified personal risk factors, not elsewhere classified: Secondary | ICD-10-CM | POA: Insufficient documentation

## 2023-03-21 DIAGNOSIS — C775 Secondary and unspecified malignant neoplasm of intrapelvic lymph nodes: Secondary | ICD-10-CM | POA: Diagnosis not present

## 2023-03-21 DIAGNOSIS — I159 Secondary hypertension, unspecified: Secondary | ICD-10-CM

## 2023-03-21 DIAGNOSIS — C61 Malignant neoplasm of prostate: Secondary | ICD-10-CM | POA: Diagnosis present

## 2023-03-21 DIAGNOSIS — I10 Essential (primary) hypertension: Secondary | ICD-10-CM | POA: Diagnosis not present

## 2023-03-21 DIAGNOSIS — Z7952 Long term (current) use of systemic steroids: Secondary | ICD-10-CM | POA: Diagnosis not present

## 2023-03-21 LAB — CBC WITH DIFFERENTIAL (CANCER CENTER ONLY)
Abs Immature Granulocytes: 0.02 10*3/uL (ref 0.00–0.07)
Basophils Absolute: 0.1 10*3/uL (ref 0.0–0.1)
Basophils Relative: 1 %
Eosinophils Absolute: 0.3 10*3/uL (ref 0.0–0.5)
Eosinophils Relative: 4 %
HCT: 46 % (ref 39.0–52.0)
Hemoglobin: 14.8 g/dL (ref 13.0–17.0)
Immature Granulocytes: 0 %
Lymphocytes Relative: 31 %
Lymphs Abs: 2.1 10*3/uL (ref 0.7–4.0)
MCH: 28.2 pg (ref 26.0–34.0)
MCHC: 32.2 g/dL (ref 30.0–36.0)
MCV: 87.6 fL (ref 80.0–100.0)
Monocytes Absolute: 0.8 10*3/uL (ref 0.1–1.0)
Monocytes Relative: 12 %
Neutro Abs: 3.7 10*3/uL (ref 1.7–7.7)
Neutrophils Relative %: 52 %
Platelet Count: 167 10*3/uL (ref 150–400)
RBC: 5.25 MIL/uL (ref 4.22–5.81)
RDW: 13.6 % (ref 11.5–15.5)
WBC Count: 7 10*3/uL (ref 4.0–10.5)
nRBC: 0 % (ref 0.0–0.2)

## 2023-03-21 LAB — CMP (CANCER CENTER ONLY)
ALT: 16 U/L (ref 0–44)
AST: 16 U/L (ref 15–41)
Albumin: 3.8 g/dL (ref 3.5–5.0)
Alkaline Phosphatase: 67 U/L (ref 38–126)
Anion gap: 3 — ABNORMAL LOW (ref 5–15)
BUN: 14 mg/dL (ref 8–23)
CO2: 34 mmol/L — ABNORMAL HIGH (ref 22–32)
Calcium: 9.5 mg/dL (ref 8.9–10.3)
Chloride: 108 mmol/L (ref 98–111)
Creatinine: 1.07 mg/dL (ref 0.61–1.24)
GFR, Estimated: >60 mL/min (ref >60)
Glucose, Bld: 84 mg/dL (ref 70–99)
Potassium: 3.8 mmol/L (ref 3.5–5.1)
Sodium: 145 mmol/L (ref 135–145)
Total Bilirubin: 0.4 mg/dL (ref 0.0–1.2)
Total Protein: 6.7 g/dL (ref 6.5–8.1)

## 2023-03-21 MED ORDER — PREDNISONE 5 MG PO TABS: 5.0000 mg | ORAL_TABLET | Freq: Every day | ORAL | 3 refills | Status: DC

## 2023-03-21 MED ORDER — ABIRATERONE ACETATE 250 MG PO TABS: 1000.0000 mg | ORAL_TABLET | Freq: Every day | ORAL | 0 refills | Status: DC

## 2023-03-21 NOTE — Assessment & Plan Note (Signed)
 Supportive baseline bone mineral density study and then every 2 years calcium (1000-1200 mg daily from food and supplements) and vitamin D3 (1000 IU daily) Dental care regularly Control and prevent diabetes Aggressive cardiovascular risk management Exercises as able (30 minutes per day) Limit alcohol consumption and avoid smoking

## 2023-03-21 NOTE — Assessment & Plan Note (Signed)
 Patient has been on Lipitor.  Will monitor closely for side effects after starting abiraterone.

## 2023-03-21 NOTE — Patient Instructions (Addendum)
 Dear Chase Cook  Based on the information you have prostate cancer spread to the nearby lymph node. Study showed medication like abiraterone with low dose predinsone adding to radiation can result in better outcome.  Stampede was the trial showed survival benefit of adding abiraterone with prednisone to the shot you are getting in patients with high risk nonmetastatic and lymph node positive disease.  Reports 6-year free from cancer spread to other parts of the body was 82% with abiraterone for 2 years versus 69%. (ESMO 2021).  Side effect of abiraterone plus prednisone can be hypertension, hot flush, joint pain, decreased potassium, fatigue, increased liver enzymes, constipation, edema, hyperglycemia, anemia, muscle pain or weakness, long term for osteoporosis, infection. Severe liver toxicity and death has been reported. Rare arrhythmia and heart failure have been reported.  We will need to check your labs every 2 weeks for liver function for the first 3 months and make sure it looks fine before we stretched out the visit.

## 2023-03-21 NOTE — Telephone Encounter (Signed)
 Oral Oncology Patient Advocate Encounter   Received notification that prior authorization for abiraterone is required.   PA submitted on 03/21/23 Key [id number] Status is pending     Omer Jack, CPhT-Adv Oncology Pharmacy Patient Advocate [facility]  Direct Number: [phone]  Fax: [phone]

## 2023-03-21 NOTE — Assessment & Plan Note (Addendum)
 Patient has been on only one BP med at 1/2 dose but amlodipine 10 mg daily, valsartan 160 mg daily are file.  BP appeared to be uncontrolled.

## 2023-03-21 NOTE — Telephone Encounter (Signed)
 Oral Oncology Patient Advocate Encounter  Prior Authorization for abiraterone has been approved.    PA# [id number] Effective dates: 03/21/23 through 03/20/24  Patient must fill at [facility].    Omer Jack, CPhT-Adv Oncology Pharmacy Patient Advocate [facility]  Direct Number: [phone]  Fax: [phone]

## 2023-03-22 LAB — PROSTATE-SPECIFIC AG, SERUM (LABCORP): Prostate Specific Ag, Serum: 15.1 ng/mL — ABNORMAL HIGH (ref 0.0–4.0)

## 2023-03-22 LAB — TESTOSTERONE: Testosterone: 33 ng/dL — ABNORMAL LOW (ref 264–916)

## 2023-03-22 NOTE — Telephone Encounter (Addendum)
 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  Labs from 03/21/2023 assessed, no interventions needed. Prescription dose and frequency assessed for appropriateness.   Current medication list in Epic reviewed, DDIs with Zytiga identified: - atorvastatin (cat C): zytiga may enhance the myopathic effect of statins. Will have patient monitor for any evidence of muscle toxicities (myopathy, rhabdomyolysis) when zytiga and statins are combined.   - tamsulosin (cat C): zytiga may increase the concentration of tamsulosin effects (hypotension, orthostasis) if combined. Will have patient monitor for any of these side effects.  ** to note: his medication dispense history does not report any new fills of these medications or his blood pressure medication since 2022.  Patients blood pressure is currently unconotrolled at 160/79- per MD, patient is to not start the zytiga until his blood pressure is better controlled.  Counseled patient on administration, dosing, side effects, monitoring, drug-food interactions, safe handling, storage, and disposal.  Patient will take Zytiga 250mg  tablets, 4 tablets (1000mg ) by mouth once daily on an empty stomach, 1 hour before or 2 hours after a meal. Patient states he will take his Zytiga in the morning and will wait at least 1 hour before eating. Patient will take prednisone 5mg  tablet, 1 tablet by mouth one daily with breakfast.  Adverse effects include but are not limited to: peripheral edema, GI upset, hypertension, hot flashes, fatigue, and arthralgias.    Prednisone prescription has been sent to [facility]. Patient will obtain prednisone and knows to start prednisone on the same day as Zytiga  start.  Reviewed with patient importance of keeping a medication schedule and plan for any missed doses. No barriers to medication adherence identified.  Medication reconciliation performed and medication/allergy list updated. All questions answered. Patient voiced understanding and appreciation.  Medication education handout placed in mail for patient. Patient knows to call the office with questions or concerns. Oral Chemotherapy Clinic phone number provided to patient.   Evaluated chart and no patient barriers to medication adherence noted.   Patient agreement for treatment documented in MD note on 03/21/2023.  Prescription has been e-scribed to the [facility] for benefits analysis and approval.  Oral Oncology Clinic will continue to follow for insurance authorization, copayment issues, initial counseling and start date.  Mandee Pluta, PharmD Hematology/Oncology Clinical Pharmacist [facility] Oral Chemotherapy Navigation Clinic [phone] 03/22/2023 9:04 AM

## 2023-03-27 NOTE — Assessment & Plan Note (Signed)
 Plan to start Zytiga 1000 mg with prednisone 5 mg daily for total of 2 years as tolerated, pending controlling his HTN ADT for 2 years Follow-up with radiation oncology team as recommended Baseline CBC, CMP, PSA and testosterone CMP every 2 weeks for 52-months with follow-up

## 2023-03-27 NOTE — Progress Notes (Unsigned)
 Patient Care Team: Benjiman Core, MD as PCP - General (Internal Medicine) Cherlyn Cushing, RN as Oncology Nurse Navigator  Clinic Day:  03/28/2023  Referring physician: Benjiman Core*  ASSESSMENT & PLAN:   Assessment & Plan: Chase Cook is a 78 y.o.male with history of hypertension, hyperlipidemia being seen at Medical Oncology Clinic for prostate cancer.   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    Germline testing: not yet Somatic testing: not yet Treatment:  8 weeks of external beam therapy concurrent with LT-ADT    He is getting [facility]T from [facility]. Tomorrow he has more ADT.  Uncontrolled BP on valsartan. Will add hydrochlorothiazide. Return in 2 weeks with BP diary before starting AAP.  Malignant neoplasm of prostate (HCC) Plan to start Zytiga 1000 mg with prednisone 5 mg daily for total of 2 years as tolerated, pending controlling his HTN ADT for 2 years Follow-up with radiation oncology team as recommended Baseline CBC, CMP, PSA and testosterone CMP every 2 weeks for 53-months with follow-up  Hypertension Continue valsartan 160 mg daily Adding hydrochlorothiazide 25 mg daily Return in 2 weeks.    The patient understands the plans discussed today and is in agreement with them.  He knows to contact our office if he develops concerns prior to his next appointment.  Melven Sartorius, MD  [facility] - A DEPT OF [facility] [address] [place] [address] Dept: [phone] Dept Fax: [phone]   No orders of the defined types were placed in this encounter.     CHIEF COMPLAINT:  CC: prostate cancer  INTERVAL HISTORY:  Chase Cook is here today for repeat clinical assessment due to uncontrolled BP.  He started valsartan 160 mg daily since last visit. His SBP ranges from 140-171's.  He  denies chest pain or short of breath or headache. His appetite is good. Weak stream is about the same.  I have reviewed the past medical history, past surgical history, social history and family history with the patient and they are unchanged from previous note.  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  MEDICATIONS:  Current Outpatient Medications  Medication Sig Dispense Refill   abiraterone acetate (ZYTIGA) 250 MG tablet Take 4 tablets (1,000 mg total) by mouth daily. Take on an empty stomach 1 hour before or 2 hours after a meal 120 each 0   atorvastatin (LIPITOR) 10 MG tablet Take 10 mg by mouth daily.     betamethasone dipropionate (DIPROLENE) 0.05 % ointment Apply topically 2 (two) times daily.     cholecalciferol (VITAMIN D3) 25 MCG (1000 UNIT) tablet Take 1,000 Units by mouth daily.     clotrimazole (LOTRIMIN) 1 % cream Apply 1 Application topically as needed.     hydrochlorothiazide (HYDRODIURIL) 25 MG tablet Take 1 tablet (25 mg total) by mouth daily. 90 tablet 0   oxybutynin (DITROPAN) 5 MG tablet Take 5 mg by mouth 3 (three) times daily.     predniSONE (DELTASONE) 5 MG tablet Take 1 tablet (5 mg total) by mouth daily with breakfast. 90 tablet 3   tamsulosin (FLOMAX) 0.4 MG CAPS capsule Take 0.4 mg by mouth.     valsartan (DIOVAN) 160 MG tablet Take 160 mg by mouth daily. Take one half tablet for blood pressure.     No current facility-administered medications for this visit.  HISTORY OF PRESENT ILLNESS:   Oncology History  Malignant neoplasm of prostate (HCC)  01/04/2023 Pathology Results   MRI fusion biopsy  14/15 core biopsies positive.  The maximum Gleason score was 4+5 and multiple 4+4, and 4+3 prostate adenocarcinoma   02/04/2023 PET scan   PSMA PET 1. Intense radiotracer activity within the prostate gland consistent primary prostate adenocarcinoma. 2. Metastatic lymph node posterior RIGHT of the rectum as described above. Lymph nodes: Radiotracer  avid lymph node in the mesorectal sheath posterior RIGHT of the rectum measuring 8 mm on image 168 with SUV max equal 11.7. 3. No evidence of metastatic adenopathy outside the pelvis. 4. No visceral metastasis or skeletal metastasis.   02/25/2023 -  Chemotherapy   started on ADT with Firmagon on 02/25/23.    02/26/2023 Initial Diagnosis   Malignant neoplasm of prostate (HCC)   02/26/2023 Cancer Staging   Staging form: Prostate, AJCC 8th Edition - Clinical stage from 02/26/2023: Stage IVA (cT1c, cN1, cM0, PSA: 18.7, Grade Group: 5) - Signed by Margaretmary Dys, MD on 02/26/2023 Histopathologic type: Adenocarcinoma, NOS Stage prefix: Initial diagnosis Prostate specific antigen (PSA) range: 10 to 19 Gleason primary pattern: 4 Gleason secondary pattern: 5 Gleason score: 9 Histologic grading system: 5 grade system     Past Medical History:  Diagnosis Date   Elevated PSA    High cholesterol    Hypertension      REVIEW OF SYSTEMS:   All relevant systems were reviewed with the patient and are negative.   VITALS:  Blood pressure (!) 181/86, pulse 85, temperature (!) 97.2 F (36.2 C), temperature source Temporal, resp. rate 15, weight 252 lb 11.2 oz (114.6 kg), SpO2 94%.  Wt Readings from Last 3 Encounters:  03/28/23 252 lb 11.2 oz (114.6 kg)  03/21/23 251 lb 1.6 oz (113.9 kg)  02/26/23 252 lb (114.3 kg)    Body mass index is 40.79 kg/m.   PHYSICAL EXAM:   GENERAL:alert, no distress and comfortable LUNGS: normal breathing effort.    LABORATORY DATA:  I have reviewed the data as listed    Component Value Date/Time   NA 145 03/21/2023 1229   K 3.8 03/21/2023 1229   CL 108 03/21/2023 1229   CO2 34 (H) 03/21/2023 1229   GLUCOSE 84 03/21/2023 1229   BUN 14 03/21/2023 1229   CREATININE 1.07 03/21/2023 1229   CALCIUM 9.5 03/21/2023 1229   PROT 6.7 03/21/2023 1229   ALBUMIN 3.8 03/21/2023 1229   AST 16 03/21/2023 1229   ALT 16 03/21/2023 1229   ALKPHOS 67 03/21/2023 1229    BILITOT 0.4 03/21/2023 1229   GFRNONAA >60 03/21/2023 1229   GFRAA >60 06/30/2014 2100    No results found for: "SPEP", "UPEP"  Lab Results  Component Value Date   WBC 7.0 03/21/2023   NEUTROABS 3.7 03/21/2023   HGB 14.8 03/21/2023   HCT 46.0 03/21/2023   MCV 87.6 03/21/2023   PLT 167 03/21/2023      Chemistry      Component Value Date/Time   NA 145 03/21/2023 1229   K 3.8 03/21/2023 1229   CL 108 03/21/2023 1229   CO2 34 (H) 03/21/2023 1229   BUN 14 03/21/2023 1229   CREATININE 1.07 03/21/2023 1229      Component Value Date/Time   CALCIUM 9.5 03/21/2023 1229   ALKPHOS 67 03/21/2023 1229   AST 16 03/21/2023 1229   ALT 16 03/21/2023 1229   BILITOT 0.4 03/21/2023 1229  RADIOGRAPHIC STUDIES: I have personally reviewed the radiological images as listed and agreed with the findings in the report. No results found.

## 2023-03-27 NOTE — Assessment & Plan Note (Signed)
 Patient has been on only one BP med at 1/2 dose but amlodipine 10 mg daily, valsartan 160 mg daily are file.  BP appeared to be uncontrolled.

## 2023-03-28 ENCOUNTER — Inpatient Hospital Stay

## 2023-03-28 VITALS — BP 181/86 | HR 85 | Temp 97.2°F | Resp 15 | Wt 252.7 lb

## 2023-03-28 DIAGNOSIS — I159 Secondary hypertension, unspecified: Secondary | ICD-10-CM

## 2023-03-28 DIAGNOSIS — C61 Malignant neoplasm of prostate: Secondary | ICD-10-CM | POA: Diagnosis not present

## 2023-03-28 MED ORDER — HYDROCHLOROTHIAZIDE 25 MG PO TABS: 25.0000 mg | ORAL_TABLET | Freq: Every day | ORAL | 0 refills | Status: DC

## 2023-04-11 NOTE — Progress Notes (Unsigned)
 [facility] OFFICE PROGRESS NOTE  Patient Care Team: Benjiman Core, MD as PCP - General (Internal Medicine) Cherlyn Cushing, RN as Oncology Nurse Navigator  Chase Cook is a 78 y.o.male with history of hypertension, hyperlipidemia being seen at Medical Oncology Clinic for prostate cancer.   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    Germline testing: not yet Somatic testing: not yet Treatment:  8 weeks of external beam therapy concurrent with LT-ADT    He is getting [facility]T [facility]. Tomorrow he has more ADT.   Uncontrolled BP on valsartan. Will add hydrochlorothiazide. Return in 2 weeks with BP diary before starting AAP.  Assessment & Plan   No orders of the defined types were placed in this encounter.    Chase Sartorius, MD  INTERVAL HISTORY: Patient returns for follow-up.  Oncology History  Malignant neoplasm of prostate (HCC)  01/04/2023 Pathology Results   MRI fusion biopsy  14/15 core biopsies positive.  The maximum Gleason score was 4+5 and multiple 4+4, and 4+3 prostate adenocarcinoma   02/04/2023 PET scan   PSMA PET 1. Intense radiotracer activity within the prostate gland consistent primary prostate adenocarcinoma. 2. Metastatic lymph node posterior RIGHT of the rectum as described above. Lymph nodes: Radiotracer avid lymph node in the mesorectal sheath posterior RIGHT of the rectum measuring 8 mm on image 168 with SUV max equal 11.7. 3. No evidence of metastatic adenopathy outside the pelvis. 4. No visceral metastasis or skeletal metastasis.   02/25/2023 -  Chemotherapy   started on ADT with Firmagon on 02/25/23.    02/26/2023 Initial Diagnosis   Malignant neoplasm of prostate (HCC)   02/26/2023 Cancer Staging   Staging form: Prostate, AJCC 8th Edition - Clinical stage from 02/26/2023: Stage IVA (cT1c, cN1, cM0, PSA: 18.7, Grade Group: 5) -  Signed by Margaretmary Dys, MD on 02/26/2023 Histopathologic type: Adenocarcinoma, NOS Stage prefix: Initial diagnosis Prostate specific antigen (PSA) range: 10 to 19 Gleason primary pattern: 4 Gleason secondary pattern: 5 Gleason score: 9 Histologic grading system: 5 grade system      PHYSICAL EXAMINATION: ECOG PERFORMANCE STATUS: {CHL ONC ECOG PS:[id number]}  There were no vitals filed for this visit. There were no vitals filed for this visit.  Relevant data reviewed during this visit included ***

## 2023-04-12 ENCOUNTER — Other Ambulatory Visit: Payer: Self-pay

## 2023-04-12 ENCOUNTER — Inpatient Hospital Stay (HOSPITAL_BASED_OUTPATIENT_CLINIC_OR_DEPARTMENT_OTHER)

## 2023-04-12 VITALS — BP 147/84 | HR 64 | Temp 97.7°F | Resp 20 | Wt 251.4 lb

## 2023-04-12 DIAGNOSIS — Z9189 Other specified personal risk factors, not elsewhere classified: Secondary | ICD-10-CM | POA: Diagnosis not present

## 2023-04-12 DIAGNOSIS — I159 Secondary hypertension, unspecified: Secondary | ICD-10-CM

## 2023-04-12 DIAGNOSIS — E7800 Pure hypercholesterolemia, unspecified: Secondary | ICD-10-CM | POA: Diagnosis not present

## 2023-04-12 DIAGNOSIS — C61 Malignant neoplasm of prostate: Secondary | ICD-10-CM | POA: Diagnosis not present

## 2023-04-12 MED ORDER — VALSARTAN 160 MG PO TABS: 160.0000 mg | ORAL_TABLET | Freq: Every day | ORAL | 3 refills | Status: DC

## 2023-04-12 NOTE — Assessment & Plan Note (Addendum)
 Continue valsartan 160 mg daily Continue hydrochlorothiazide 25 mg daily Return in 2 weeks with BP diary

## 2023-04-12 NOTE — Assessment & Plan Note (Addendum)
 Patient has been on Lipitor.  Will monitor closely for side effects after starting abiraterone.

## 2023-04-12 NOTE — Assessment & Plan Note (Addendum)
 Supportive Labs every 2 week for 3 months to check LFT baseline bone mineral density study and then every 2 years calcium (1000-1200 mg daily from food and supplements) and vitamin D3 (1000 IU daily) Dental care regularly Healthy diet to prevent diabetes Aggressive cardiovascular risk management Exercises as able (30 minutes per day) Limit alcohol consumption and avoid smoking

## 2023-04-12 NOTE — Assessment & Plan Note (Addendum)
 Will start Zytiga 1000 mg with prednisone 5 mg daily for total of 2 years as tolerated, pending controlling his HTN ADT for 2 years Follow-up with radiation oncology team as recommended CMP every 2 weeks for 72-months with follow-up

## 2023-04-19 ENCOUNTER — Other Ambulatory Visit: Payer: Self-pay

## 2023-04-19 DIAGNOSIS — C61 Malignant neoplasm of prostate: Secondary | ICD-10-CM

## 2023-04-24 NOTE — Progress Notes (Unsigned)
 Chase Cook OFFICE PROGRESS NOTE  Patient Care Team: Benjiman Core, MD as PCP - General (Internal Medicine) Cherlyn Cushing, RN as Oncology Nurse Navigator  Chase Cook is a 78 y.o.male with history of hypertension, hyperlipidemia being seen at Medical Oncology Clinic for prostate cancer.   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  Germline testing: not yet Somatic testing: not yet Treatment:  8 weeks of external beam therapy concurrent with LT-ADT  04/13/23 started AAP   He is getting ADT from [facility]. Last dose report a six month dosage.   BP better controlled now adding hydrochlorothiazide with valsartan. No concerning BP at home. Assessment & Plan Malignant neoplasm of prostate (HCC) Continue Zytiga 1000 mg with prednisone 5 mg daily for total of 2 years as tolerated, pending controlling his HTN ADT for 2 years Follow-up with radiation oncology team as recommended CMP every 2 weeks for 52-months with follow-up At risk for side effect of medication Supportive Labs every 2 week for 3 months to check LFT baseline bone mineral density study and then every 2 years calcium (1000-1200 mg daily from food and supplements) and vitamin D3 (1000 IU daily) Dental care regularly Healthy diet to prevent diabetes Aggressive cardiovascular risk management Exercises as able (30 minutes per day) Limit alcohol consumption and avoid smoking Secondary hypertension Continue valsartan 160 mg daily Continue hydrochlorothiazide 25 mg daily Return in 2 weeks with BP diary  Orders Placed This Encounter  Procedures   CMP ([name])    Standing Status:   Future    Expiration Date:   04/25/2024     Melven Sartorius, MD  INTERVAL HISTORY: he returns for treatment follow-up.  He denies any side effects so far.  No headaches, chest pain, or difficulty with fatigue.   Reported that BP at home is mostly in the 140s SBP. No diarrhea.  Oncology History  Malignant neoplasm of prostate (HCC)  01/04/2023 Pathology Results   MRI fusion biopsy  14/15 core biopsies positive.  The maximum Gleason score was 4+5 and multiple 4+4, and 4+3 prostate adenocarcinoma   02/04/2023 PET scan   PSMA PET 1. Intense radiotracer activity within the prostate gland consistent primary prostate adenocarcinoma. 2. Metastatic lymph node posterior RIGHT of the rectum as described above. Lymph nodes: Radiotracer avid lymph node in the mesorectal sheath posterior RIGHT of the rectum measuring 8 mm on image 168 with SUV max equal 11.7. 3. No evidence of metastatic adenopathy outside the pelvis. 4. No visceral metastasis or skeletal metastasis.   02/25/2023 -  Chemotherapy   started on ADT with Firmagon on 02/25/23.    02/26/2023 Initial Diagnosis   Malignant neoplasm of prostate (HCC)   02/26/2023 Cancer Staging   Staging form: Prostate, AJCC 8th Edition - Clinical stage from 02/26/2023: Stage IVA (cT1c, cN1, cM0, PSA: 18.7, Grade Group: 5) - Signed by Margaretmary Dys, MD on 02/26/2023 Histopathologic type: Adenocarcinoma, NOS Stage prefix: Initial diagnosis Prostate specific antigen (PSA) range: 10 to 19 Gleason primary pattern: 4 Gleason secondary pattern: 5 Gleason score: 9 Histologic grading system: 5 grade system      PHYSICAL EXAMINATION: ECOG PERFORMANCE STATUS: 1 - Symptomatic but completely ambulatory  Vitals:   04/26/23 1251  BP: (!) 170/84  Pulse: 64  Resp: 13  Temp: 98.1 F (36.7 C)  SpO2: 97%   Filed Weights   04/26/23 1251  Weight: 251 lb 12.8 oz (  114.2 kg)   No edema No distress  Relevant data reviewed during this visit included labs.

## 2023-04-25 NOTE — Assessment & Plan Note (Signed)
 Continue valsartan 160 mg daily Continue hydrochlorothiazide 25 mg daily Return in 2 weeks with BP diary

## 2023-04-25 NOTE — Assessment & Plan Note (Signed)
 Continue Zytiga 1000 mg with prednisone 5 mg daily for total of 2 years as tolerated, pending controlling his HTN ADT for 2 years Follow-up with radiation oncology team as recommended CMP every 2 weeks for 37-months with follow-up

## 2023-04-25 NOTE — Assessment & Plan Note (Signed)
 Supportive Labs every 2 week for 3 months to check LFT baseline bone mineral density study and then every 2 years calcium (1000-1200 mg daily from food and supplements) and vitamin D3 (1000 IU daily) Dental care regularly Healthy diet to prevent diabetes Aggressive cardiovascular risk management Exercises as able (30 minutes per day) Limit alcohol consumption and avoid smoking

## 2023-04-26 ENCOUNTER — Inpatient Hospital Stay (HOSPITAL_BASED_OUTPATIENT_CLINIC_OR_DEPARTMENT_OTHER)

## 2023-04-26 ENCOUNTER — Inpatient Hospital Stay

## 2023-04-26 VITALS — BP 170/84 | HR 64 | Temp 98.1°F | Resp 13 | Wt 251.8 lb

## 2023-04-26 DIAGNOSIS — I159 Secondary hypertension, unspecified: Secondary | ICD-10-CM | POA: Diagnosis not present

## 2023-04-26 DIAGNOSIS — Z79899 Other long term (current) drug therapy: Secondary | ICD-10-CM | POA: Insufficient documentation

## 2023-04-26 DIAGNOSIS — C775 Secondary and unspecified malignant neoplasm of intrapelvic lymph nodes: Secondary | ICD-10-CM | POA: Diagnosis present

## 2023-04-26 DIAGNOSIS — C61 Malignant neoplasm of prostate: Secondary | ICD-10-CM | POA: Diagnosis present

## 2023-04-26 DIAGNOSIS — Z9189 Other specified personal risk factors, not elsewhere classified: Secondary | ICD-10-CM

## 2023-04-26 LAB — CMP (CANCER CENTER ONLY)
ALT: 14 U/L (ref 0–44)
AST: 15 U/L (ref 15–41)
Albumin: 3.8 g/dL (ref 3.5–5.0)
Alkaline Phosphatase: 65 U/L (ref 38–126)
Anion gap: 3 — ABNORMAL LOW (ref 5–15)
BUN: 20 mg/dL (ref 8–23)
CO2: 35 mmol/L — ABNORMAL HIGH (ref 22–32)
Calcium: 9.3 mg/dL (ref 8.9–10.3)
Chloride: 105 mmol/L (ref 98–111)
Creatinine: 1.15 mg/dL (ref 0.61–1.24)
GFR, Estimated: >60 mL/min (ref >60)
Glucose, Bld: 166 mg/dL — ABNORMAL HIGH (ref 70–99)
Potassium: 3.3 mmol/L — ABNORMAL LOW (ref 3.5–5.1)
Sodium: 143 mmol/L (ref 135–145)
Total Bilirubin: 0.5 mg/dL (ref 0.0–1.2)
Total Protein: 6.8 g/dL (ref 6.5–8.1)

## 2023-04-26 NOTE — Progress Notes (Signed)
 RN left message for call back to review next steps with treatment plan.

## 2023-04-29 NOTE — Progress Notes (Signed)
 RN left message for call back to review next steps with treatment.

## 2023-04-30 ENCOUNTER — Encounter (HOSPITAL_COMMUNITY): Payer: Self-pay | Admitting: Urology

## 2023-04-30 NOTE — Telephone Encounter (Signed)
 Oral Oncology Pharmacist Encounter  I spoke with patient today to see how patient is tolerating the Zytiga. Patient states he started on 04/13/23 and is not having any adverse reactions to note. Patient is taking his blood pressure and states that it is usually around 140/80. He has not taken his blood pressure yet today. Patient is taking his blood pressure medications and states he is taking the hydrochlorothiazide one tablet daily and he is taking the valsartan one tablet daily.   Caeli Linehan, PharmD Hematology/Oncology Clinical Pharmacist [facility] [phone]

## 2023-05-01 ENCOUNTER — Other Ambulatory Visit: Payer: Self-pay | Admitting: Urology

## 2023-05-01 ENCOUNTER — Encounter (HOSPITAL_COMMUNITY): Payer: Self-pay | Admitting: Urology

## 2023-05-01 DIAGNOSIS — C61 Malignant neoplasm of prostate: Secondary | ICD-10-CM

## 2023-05-01 NOTE — Progress Notes (Addendum)
 Spoke w/ via phone for pre-op interview--- Adynn and wife [employer] needs dos----   BMP and EKG per anesthesia      Lab results------ COVID test -----patient states asymptomatic no test needed Arrive at -------0530 NPO after MN NO Solid Food.   Pre-Surgery Ensure or G2:  Med rec completed Medications to take morning of surgery ----- Zytiga, Ditropan, Prednisone and Flomax. Diabetic medication -----  GLP1 agonist last dose: GLP1 instructions:  Patient instructed no nail polish to be worn day of surgery Patient instructed to bring photo id and insurance card day of surgery Patient aware to have Driver (ride ) / caregiver    for 24 hours after surgery - Wife Hobson Luna Patient Special Instructions ----- Shower with antibacterial soap. Per surgeon orders pt to use Fleets enema night before procedure, pt verbalized understanding. Pre-Op special Instructions -----  Patient verbalized understanding of instructions that were given at this phone interview. Patient denies chest pain, sob, fever, cough at the interview.

## 2023-05-02 NOTE — Progress Notes (Addendum)
 RN left message for call back to review next steps.   RN was able to speak with patient and wife to review next steps.  All questions answered.  No additional needs at this time.

## 2023-05-06 NOTE — H&P (Signed)
 CC/HPI: 78 year old male previous patient of Dr. 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 Patient was seen at the [facility] recently and has been referred back for fusion biopsy of TI-RADS 5 lesion on MRI.   Per OSH records imaging not available prostate volume 63 cc, PI-RADS 5 lesion noted in the posterior right to the mid posterior left peripheral zone.   HR PCa:  01/04/2023: no FH of PCa, no FH of breast cancer, patient has possible exposure to agent orange. Patient is on tamsulosin and vesicare, patient has frequency. No GH. No abx in the past year.  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  02/24/23: firmagon, discuss tumor board   BPH:  01/04/2023 Main problems frequency continues to be on tamsulosin not bothered by symptoms currently.  01/30/23: post bx  02/24/23: evaluate symptoms.   PMH no DM, patient does not take blood thinners   04/24/2023: Pt's PET scan Jan 2025 with prostate activity and a single per-rectal node. Dr. Cathi Cluster presented pt at [facility] - planning XRT and abiraterone . Started ADT Mar 2025. Saw Dr. Alita Irwin and Dr. Lorri Rota. Started abi/pred with Dr. Alita Irwin. He is well.     ALLERGIES: No Allergies    MEDICATIONS: Tamsulosin HCl 0.4 MG Capsule  Abiraterone  Acetate  Atorvastatin Calcium 10 MG Tablet Oral  predniSONE   Sildenafil Citrate 100 MG Tablet  SM Calcium/Vitamin D3 600-800 MG-UNIT Tablet 1 tablet PO BID  Valsartan  160 MG Tablet  Vitamin D3     GU PSH: Prostate Needle Biopsy - 01/30/2023     NON-GU PSH: Surgical Pathology, Gross And Microscopic Examination For Prostate Needle - 01/30/2023 Visit Complexity (formerly [facility]PC1X) - 02/06/2023, 01/04/2023     GU PMH: Prostate Cancer -  03/29/2023, - 02/25/2023, - 02/06/2023 BPH w/LUTS - 02/25/2023, - 02/06/2023, - 01/04/2023, - 2021, Benign prostatic hyperplasia with urinary obstruction, - 2016 Urinary Frequency - 02/06/2023, (Stable), - 01/04/2023, Urinary frequency, - 2016 Elevated PSA - 01/30/2023, - 01/04/2023, - 2021, PSA,Elevated, - 2014, PSA,Elevated, - 2014, Elevated prostate specific antigen (PSA), - 2014 Nocturia - 2021, Nocturia, - 2016      PMH Notes:  2007-02-12 14:56:11 - Note: Urinary Tract Infection   NON-GU PMH: Encounter for general adult medical examination without abnormal findings, Encounter for preventive health examination - 2016 Personal history of other endocrine, nutritional and metabolic disease, History of hypercholesterolemia - 2016 Personal history of other diseases of the circulatory system, History of hypertension - 2014 Personal history of other specified conditions, History of heartburn - 2014 GERD Hypertension    FAMILY HISTORY: Alcohol abuse - Father Blood In Urine - Runs In Family Cirrhosis - Runs In Family Father Deceased At Age12 ___ - Runs In Family Mother Deceased At Age 66 from diabetic complicati - Father   SOCIAL HISTORY: Marital Status: Married Preferred Language: English; Ethnicity: Not Hispanic Or Latino; Race: Black or African American Current Smoking Status: Patient has never smoked.   Tobacco Use Assessment Completed: Used Tobacco in last 30 days? Has never drank.  Drinks 3 caffeinated drinks per day.     Notes: Never A Smoker, Caffeine Use, Marital History - Currently Married, Tobacco Use, Alcohol Use   REVIEW OF SYSTEMS:    GU  Review Male:   Patient denies burning/ pain with urination, frequent urination, leakage of urine, erection problems, stream starts and stops, get up at night to urinate, hard to postpone urination, have to strain to urinate , penile pain, and trouble starting your stream.  Gastrointestinal (Upper):   Patient denies nausea, vomiting, and  indigestion/ heartburn.  Gastrointestinal (Lower):   Patient denies diarrhea and constipation.  Constitutional:   Patient denies fever, night sweats, weight loss, and fatigue.  Skin:   Patient denies skin rash/ lesion and itching.  Eyes:   Patient denies blurred vision and double vision.  Ears/ Nose/ Throat:   Patient denies sore throat and sinus problems.  Hematologic/Lymphatic:   Patient denies swollen glands and easy bruising.  Cardiovascular:   Patient denies leg swelling and chest pains.  Respiratory:   Patient denies cough and shortness of breath.  Endocrine:   Patient denies excessive thirst.  Musculoskeletal:   Patient denies back pain and joint pain.  Neurological:   Patient denies headaches and dizziness.  Psychologic:   Patient denies depression and anxiety.   VITAL SIGNS: None   Complexity of Data:  Records Review:   Previous Doctor Records  X-Ray Review: PET- PSMA Scan: Reviewed Films. Discussed With Patient. 2025    01/04/23 05/18/14 06/07/09 11/11/08 05/12/08 02/13/07  PSA  Total PSA 18.70 ng/mL 1.76  1.16  1.23  1.56  0.98    Notes:                     Dr. Cathi Cluster, Dr. Alita Irwin, Dr. Lorri Rota    PROCEDURES: None   ASSESSMENT:      ICD-10 Details  1 GU:   Prostate Cancer - C61 Chronic, Stable - Locally advanced prostate cancer being treated with ADT, Abi/Pred and long-term ADT. Patient has excellent treatment plan in place. Again encouraged a healthy lifestyle, stay hydrated and take calcium and vitamin D. He has an excellent treatment plan and will follow-up with Dr. Alita Irwin and Dr. Cathi Cluster for monitoring. Also we discussed the nature risk benefits and alternatives to the gold seed markers and SpaceOAR gel. He is scheduled Dr. Valeta Gaudier for that procedure. Discussed risk of bladder and rectal injury among others.  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

## 2023-05-09 NOTE — Anesthesia Preprocedure Evaluation (Signed)
 Anesthesia Evaluation  Patient identified by MRN, date of birth, ID band Patient awake    Reviewed: Allergy & Precautions, NPO status , Patient's Chart, lab work & pertinent test results  Airway Mallampati: II  TM Distance: >3 FB Neck ROM: Full    Dental  (+) Missing, Chipped, Dental Advisory Given   Pulmonary neg pulmonary ROS   Pulmonary exam normal breath sounds clear to auscultation       Cardiovascular hypertension, Pt. on medications Normal cardiovascular exam Rhythm:Regular Rate:Normal     Neuro/Psych negative neurological ROS  negative psych ROS   GI/Hepatic negative GI ROS, Neg liver ROS,,,  Endo/Other  negative endocrine ROS    Renal/GU negative Renal ROS  negative genitourinary   Musculoskeletal negative musculoskeletal ROS (+)    Abdominal   Peds  Hematology negative hematology ROS (+)   Anesthesia Other Findings   Reproductive/Obstetrics                             Anesthesia Physical Anesthesia Plan  ASA: 2  Anesthesia Plan: MAC   Post-op Pain Management: Tylenol  PO (pre-op)*   Induction: Intravenous  PONV Risk Score and Plan: 1 and Propofol  infusion, Treatment may vary due to age or medical condition, Dexamethasone  and Ondansetron   Airway Management Planned: Natural Airway  Additional Equipment:   Intra-op Plan:   Post-operative Plan:   Informed Consent: I have reviewed the patients History and Physical, chart, labs and discussed the procedure including the risks, benefits and alternatives for the proposed anesthesia with the patient or authorized representative who has indicated his/her understanding and acceptance.     Dental advisory given  Plan Discussed with: CRNA  Anesthesia Plan Comments:        Anesthesia Quick Evaluation

## 2023-05-09 NOTE — Progress Notes (Signed)
 Received call back from patient inquired when he was to do his fleet enema today if there a certain time.  Pt verbalized understanding he is to do his fleet enema tonight no certain time and he verified his medications to take morning of surgery.

## 2023-05-10 ENCOUNTER — Encounter (HOSPITAL_COMMUNITY)
Admission: RE | Disposition: A | Discharge status: Home / Self Care | Payer: Self-pay | Source: Home / Self Care | Attending: Urology

## 2023-05-10 ENCOUNTER — Telehealth: Payer: Self-pay | Admitting: *Deleted

## 2023-05-10 ENCOUNTER — Ambulatory Visit (HOSPITAL_BASED_OUTPATIENT_CLINIC_OR_DEPARTMENT_OTHER): Admitting: Anesthesiology

## 2023-05-10 ENCOUNTER — Ambulatory Visit (HOSPITAL_COMMUNITY)
Admission: RE | Admit: 2023-05-10 | Discharge: 2023-05-10 | Disposition: A | Discharge status: Home / Self Care | Payer: No Typology Code available for payment source | Attending: Urology | Admitting: Urology

## 2023-05-10 ENCOUNTER — Ambulatory Visit (HOSPITAL_COMMUNITY): Admitting: Anesthesiology

## 2023-05-10 ENCOUNTER — Encounter (HOSPITAL_COMMUNITY): Payer: Self-pay | Admitting: Urology

## 2023-05-10 ENCOUNTER — Other Ambulatory Visit: Payer: Self-pay

## 2023-05-10 DIAGNOSIS — C61 Malignant neoplasm of prostate: Secondary | ICD-10-CM | POA: Insufficient documentation

## 2023-05-10 DIAGNOSIS — I10 Essential (primary) hypertension: Secondary | ICD-10-CM | POA: Insufficient documentation

## 2023-05-10 DIAGNOSIS — Z79899 Other long term (current) drug therapy: Secondary | ICD-10-CM | POA: Insufficient documentation

## 2023-05-10 DIAGNOSIS — C775 Secondary and unspecified malignant neoplasm of intrapelvic lymph nodes: Secondary | ICD-10-CM | POA: Diagnosis not present

## 2023-05-10 HISTORY — DX: Malignant (primary) neoplasm, unspecified: C80.1

## 2023-05-10 HISTORY — PX: SPACE OAR INSTILLATION: SHX6769

## 2023-05-10 HISTORY — PX: GOLD SEED IMPLANT: SHX6343

## 2023-05-10 LAB — BASIC METABOLIC PANEL WITH GFR
Anion gap: 9 (ref 5–15)
BUN: 14 mg/dL (ref 8–23)
CO2: 29 mmol/L (ref 22–32)
Calcium: 9.3 mg/dL (ref 8.9–10.3)
Chloride: 106 mmol/L (ref 98–111)
Creatinine, Ser: 1.1 mg/dL (ref 0.61–1.24)
GFR, Estimated: >60 mL/min (ref >60)
Glucose, Bld: 122 mg/dL — ABNORMAL HIGH (ref 70–99)
Potassium: 3.4 mmol/L — ABNORMAL LOW (ref 3.5–5.1)
Sodium: 144 mmol/L (ref 135–145)

## 2023-05-10 SURGERY — INSERTION, GOLD SEEDS
Anesthesia: Monitor Anesthesia Care | Site: Prostate

## 2023-05-10 MED ORDER — CHLORHEXIDINE GLUCONATE 0.12 % MT SOLN
OROMUCOSAL | Status: AC
  Filled 2023-05-10: qty 15

## 2023-05-10 MED ORDER — FENTANYL CITRATE (PF) 100 MCG/2ML IJ SOLN
INTRAMUSCULAR | Status: DC | PRN
  Administered 2023-05-10: 50 ug via INTRAVENOUS

## 2023-05-10 MED ORDER — LIDOCAINE HCL (PF) 1 % IJ SOLN
INTRAMUSCULAR | Status: AC
  Filled 2023-05-10: qty 60

## 2023-05-10 MED ORDER — CHLORHEXIDINE GLUCONATE 0.12 % MT SOLN
15.0000 mL | Freq: Once | OROMUCOSAL | Status: AC
  Administered 2023-05-10: 15 mL via OROMUCOSAL

## 2023-05-10 MED ORDER — DEXAMETHASONE SODIUM PHOSPHATE 10 MG/ML IJ SOLN
INTRAMUSCULAR | Status: DC | PRN
  Administered 2023-05-10: 10 mg via INTRAVENOUS

## 2023-05-10 MED ORDER — AMISULPRIDE (ANTIEMETIC) 5 MG/2ML IV SOLN: 10.0000 mg | Freq: Once | INTRAVENOUS | Status: DC | PRN

## 2023-05-10 MED ORDER — MIDAZOLAM HCL 2 MG/2ML IJ SOLN
INTRAMUSCULAR | Status: DC | PRN
  Administered 2023-05-10 (×2): .5 mg via INTRAVENOUS

## 2023-05-10 MED ORDER — OXYCODONE HCL 5 MG/5ML PO SOLN: 5.0000 mg | Freq: Once | ORAL | Status: DC | PRN

## 2023-05-10 MED ORDER — OXYCODONE HCL 5 MG PO TABS: 5.0000 mg | ORAL_TABLET | Freq: Once | ORAL | Status: DC | PRN

## 2023-05-10 MED ORDER — ORAL CARE MOUTH RINSE: 15.0000 mL | Freq: Once | OROMUCOSAL | Status: AC

## 2023-05-10 MED ORDER — ONDANSETRON HCL 4 MG/2ML IJ SOLN
INTRAMUSCULAR | Status: DC | PRN
  Administered 2023-05-10: 4 mg via INTRAVENOUS

## 2023-05-10 MED ORDER — SODIUM CHLORIDE (PF) 0.9 % IJ SOLN
INTRAMUSCULAR | Status: AC
  Filled 2023-05-10: qty 20

## 2023-05-10 MED ORDER — CEFAZOLIN SODIUM-DEXTROSE 2-4 GM/100ML-% IV SOLN
INTRAVENOUS | Status: AC
  Filled 2023-05-10: qty 100

## 2023-05-10 MED ORDER — LIDOCAINE HCL 1 % IJ SOLN
INTRAMUSCULAR | Status: AC
  Filled 2023-05-10: qty 40

## 2023-05-10 MED ORDER — LIDOCAINE HCL (PF) 1 % IJ SOLN
INTRAMUSCULAR | Status: DC | PRN
  Administered 2023-05-10: 10 mL

## 2023-05-10 MED ORDER — PROPOFOL 500 MG/50ML IV EMUL
INTRAVENOUS | Status: DC | PRN
  Administered 2023-05-10: 100 ug/kg/min via INTRAVENOUS

## 2023-05-10 MED ORDER — LACTATED RINGERS IV SOLN: INTRAVENOUS | Status: DC

## 2023-05-10 MED ORDER — CEFAZOLIN SODIUM-DEXTROSE 2-4 GM/100ML-% IV SOLN
2.0000 g | INTRAVENOUS | Status: AC
  Administered 2023-05-10: 2 g via INTRAVENOUS

## 2023-05-10 MED ORDER — FLEET ENEMA RE ENEM
1.0000 | ENEMA | Freq: Once | RECTAL | Status: DC
  Filled 2023-05-10: qty 1

## 2023-05-10 MED ORDER — BUPIVACAINE HCL (PF) 0.25 % IJ SOLN
INTRAMUSCULAR | Status: AC
  Filled 2023-05-10: qty 60

## 2023-05-10 MED ORDER — FENTANYL CITRATE (PF) 100 MCG/2ML IJ SOLN: 25.0000 ug | INTRAMUSCULAR | Status: DC | PRN

## 2023-05-10 MED ORDER — PROPOFOL 10 MG/ML IV BOLUS
INTRAVENOUS | Status: AC
  Filled 2023-05-10: qty 20

## 2023-05-10 MED ORDER — KETOROLAC TROMETHAMINE 15 MG/ML IJ SOLN
INTRAMUSCULAR | Status: DC | PRN
  Administered 2023-05-10: 15 mg via INTRAVENOUS

## 2023-05-10 MED ORDER — MIDAZOLAM HCL 2 MG/2ML IJ SOLN
INTRAMUSCULAR | Status: AC
  Filled 2023-05-10: qty 2

## 2023-05-10 MED ORDER — ACETAMINOPHEN 500 MG PO TABS
1000.0000 mg | ORAL_TABLET | Freq: Once | ORAL | Status: AC
  Administered 2023-05-10: 1000 mg via ORAL
  Filled 2023-05-10: qty 2

## 2023-05-10 MED ORDER — PROPOFOL 10 MG/ML IV BOLUS
INTRAVENOUS | Status: DC | PRN
  Administered 2023-05-10: 50 mg via INTRAVENOUS

## 2023-05-10 MED ORDER — PROPOFOL 1000 MG/100ML IV EMUL
INTRAVENOUS | Status: AC
Start: 2023-05-10 — End: ?
  Filled 2023-05-10: qty 100

## 2023-05-10 MED ORDER — SODIUM CHLORIDE (PF) 0.9 % IJ SOLN
INTRAMUSCULAR | Status: DC | PRN
  Administered 2023-05-10: 10 mL

## 2023-05-10 MED ORDER — ACETAMINOPHEN 500 MG PO TABS
ORAL_TABLET | ORAL | Status: AC
  Filled 2023-05-10: qty 2

## 2023-05-10 MED ORDER — FENTANYL CITRATE (PF) 250 MCG/5ML IJ SOLN
INTRAMUSCULAR | Status: AC
  Filled 2023-05-10: qty 5

## 2023-05-10 SURGICAL SUPPLY — 21 items
BLADE CLIPPER SENSICLIP SURGIC (BLADE) ×1 IMPLANT
CNTNR URN SCR LID CUP LEK RST (MISCELLANEOUS) ×1 IMPLANT
COVER BACK TABLE 60X90IN (DRAPES) ×1 IMPLANT
DRSG TEGADERM 4X4.75 (GAUZE/BANDAGES/DRESSINGS) ×1 IMPLANT
DRSG TEGADERM 8X12 (GAUZE/BANDAGES/DRESSINGS) ×1 IMPLANT
GAUZE SPONGE 4X4 12PLY STRL (GAUZE/BANDAGES/DRESSINGS) ×1 IMPLANT
GAUZE SPONGE 4X4 12PLY STRL LF (GAUZE/BANDAGES/DRESSINGS) IMPLANT
GLOVE BIO SURGEON STRL SZ 6.5 (GLOVE) ×1 IMPLANT
IMPL SPACEOAR SYSTEM 10ML (Spacer) ×1 IMPLANT
MARKER GOLD PRELOAD 1.2X3 (Urological Implant) ×1 IMPLANT
MARKER SKIN DUAL TIP RULER LAB (MISCELLANEOUS) ×1 IMPLANT
NDL SPNL 22GX3.5 QUINCKE BK (NEEDLE) ×1 IMPLANT
NEEDLE SPNL 22GX3.5 QUINCKE BK (NEEDLE) ×1 IMPLANT
SHEATH ULTRASOUND LF (SHEATH) IMPLANT
SHEATH ULTRASOUND LTX NONSTRL (SHEATH) IMPLANT
SLEEVE SCD COMPRESS KNEE MED (STOCKING) ×1 IMPLANT
SURGILUBE 2OZ TUBE FLIPTOP (MISCELLANEOUS) ×1 IMPLANT
SYR 10ML LL (SYRINGE) IMPLANT
SYR CONTROL 10ML LL (SYRINGE) ×1 IMPLANT
TOWEL OR 17X24 6PK STRL BLUE (TOWEL DISPOSABLE) ×1 IMPLANT
UNDERPAD 30X36 HEAVY ABSORB (UNDERPADS AND DIAPERS) ×1 IMPLANT

## 2023-05-10 NOTE — Op Note (Signed)
 Preoperative diagnosis: prostate cancer  Postoperative diagnosis: prostate cancer  Procedure: 1) Placement of fiducial markers into prostate                    2) Insertion of SpaceOAR hydrogel   Surgeon: Perley Bradley, MD   Anesthesia: General  EBL: Minimal  Complications: None  Indication: Chase Cook is a 78 y.o. gentleman with clinically localized prostate cancer. After discussing management options for treatment, he elected to proceed with radiotherapy. He presents today for the above procedures. The potential risks, complications, alternative options, and expected recovery course have been discussed in detail with the patient and he has provided informed consent to proceed.  Description of procedure: The patient was administered preoperative antibiotics, placed in the dorsal lithotomy position, and prepped and draped in the usual sterile fashion. Next, transrectal ultrasonography was utilized to visualize the prostate. Three gold fiducial markers were then placed into the prostate via transperineal needles under ultrasound guidance at the right apex, right base, and left mid gland under direct ultrasound guidance. A site in the midline was then selected on the perineum for placement of an 18 g needle with saline. The needle was advanced above the rectum and below Denonvillier's fascia to the mid gland and confirmed to be in the midline on transverse imaging. One cc of saline was injected confirming appropriate expansion of this space. A total of 5 cc of saline was then injected to open the space further bilaterally. The saline syringe was then removed and the SpaceOAR hydrogel was injected with good distribution bilaterally. He tolerated the procedure well and without complications. He was given a voiding trial prior to discharge from the PACU.

## 2023-05-10 NOTE — Transfer of Care (Signed)
 Immediate Anesthesia Transfer of Care Note  Patient: Chase Cook  Procedure(s) Performed: INSERTION, GOLD SEEDS (Prostate) INJECTION, HYDROGEL SPACER (Prostate)  Patient Location: PACU  Anesthesia Type:MAC  Level of Consciousness: awake and sedated  Airway & Oxygen Therapy: Patient Spontanous Breathing and Patient connected to face mask oxygen  Post-op Assessment: Report given to RN and Post -op Vital signs reviewed and stable  Post vital signs: Reviewed and stable  Last Vitals:  Vitals Value Taken Time  BP 151/88 05/10/23 0817  Temp    Pulse 58 05/10/23 0820  Resp 12 05/10/23 0819  SpO2 99 % 05/10/23 0820  Vitals shown include unfiled device data.  Last Pain:  Vitals:   05/10/23 0710  TempSrc:   PainSc: 0-No pain      Patients Stated Pain Goal: 1 (05/10/23 4132)  Complications: No notable events documented.

## 2023-05-10 NOTE — Interval H&P Note (Signed)
 History and Physical Interval Note:  05/10/2023 7:24 AM  Chase Cook  has presented today for surgery, with the diagnosis of PROSTATE CANCER.  The various methods of treatment have been discussed with the patient and family. After consideration of risks, benefits and other options for treatment, the patient has consented to  Procedure(s): INSERTION, GOLD SEEDS (N/A) INJECTION, HYDROGEL SPACER (N/A) as a surgical intervention.  The patient's history has been reviewed, patient examined, no change in status, stable for surgery.  I have reviewed the patient's chart and labs.  Questions were answered to the patient's satisfaction.     Bonner Larue D Babita Amaker

## 2023-05-10 NOTE — Telephone Encounter (Signed)
 CALLED PATIENT TO REMIND OF SIM AND MRI FOR 05/13/23- ARRIVAL TIME- 10:45 AM @ [facility] , PATIENT TO ARRIVE WITH A FULL BLADDER, AND HIS MRI - ARRIVAL TIME- 12:15 PM @ [facility], NO RESTRICTIONS TO SCAN, SPOKE WITH PATIENT AND HE IS AWARE OF THESE APPTS. AND THE INSTRUCTIONS

## 2023-05-10 NOTE — Discharge Instructions (Addendum)
You should avoid strenuous activities today but may resume all normal activities tomorrow.  2.   You can take Tylenol as needed for any pain or discomfort.  3.    Follow up with your radiation oncologist for your simulation appointment as scheduled.  If this is not currently scheduled or you do not know the date/time for that appointment, please contact the radiation oncology office to confirm.    Post Anesthesia Home Care Instructions  Activity: Get plenty of rest for the remainder of the day. A responsible individual must stay with you for 24 hours following the procedure.  For the next 24 hours, DO NOT: -Drive a car -Operate machinery -Drink alcoholic beverages -Take any medication unless instructed by your physician -Make any legal decisions or sign important papers.  Meals: Start with liquid foods such as gelatin or soup. Progress to regular foods as tolerated. Avoid greasy, spicy, heavy foods. If nausea and/or vomiting occur, drink only clear liquids until the nausea and/or vomiting subsides. Call your physician if vomiting continues.  Special Instructions/Symptoms: Your throat may feel dry or sore from the anesthesia or the breathing tube placed in your throat during surgery. If this causes discomfort, gargle with warm salt water. The discomfort should disappear within 24 hours.  

## 2023-05-12 ENCOUNTER — Other Ambulatory Visit (HOSPITAL_COMMUNITY)

## 2023-05-13 ENCOUNTER — Encounter (HOSPITAL_COMMUNITY): Payer: Self-pay | Admitting: Urology

## 2023-05-13 ENCOUNTER — Ambulatory Visit (HOSPITAL_COMMUNITY)
Admission: RE | Admit: 2023-05-13 | Discharge: 2023-05-13 | Disposition: A | Discharge status: Home / Self Care | Source: Ambulatory Visit | Attending: Urology | Admitting: Urology

## 2023-05-13 ENCOUNTER — Ambulatory Visit
Admission: RE | Admit: 2023-05-13 | Discharge: 2023-05-13 | Disposition: A | Discharge status: Home / Self Care | Payer: No Typology Code available for payment source | Source: Ambulatory Visit | Attending: Radiation Oncology | Admitting: Radiation Oncology

## 2023-05-13 DIAGNOSIS — C61 Malignant neoplasm of prostate: Secondary | ICD-10-CM | POA: Insufficient documentation

## 2023-05-13 DIAGNOSIS — Z510 Encounter for antineoplastic radiation therapy: Secondary | ICD-10-CM | POA: Insufficient documentation

## 2023-05-13 NOTE — Progress Notes (Signed)
  Radiation Oncology         [phone] ________________________________  Name: Chase Cook MRN: [id number]  Date: 05/13/2023  DOB: 10/05/1945  SIMULATION AND TREATMENT PLANNING NOTE    ICD-10-CM   1. Malignant neoplasm of prostate (HCC)  C61       DIAGNOSIS:   78 y.o. 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  Identity was confirmed.  All relevant records and images related to the planned course of therapy were reviewed.  The patient freely provided informed written consent to proceed with treatment after reviewing the details related to the planned course of therapy. The consent form was witnessed and verified by the simulation staff.  Then, the patient was set-up in a stable reproducible supine position for radiation therapy.  A vacuum lock pillow device was custom fabricated to position his legs in a reproducible immobilized position.  Then, I performed a urethrogram under sterile conditions to identify the prostatic bed.  CT images were obtained.  Surface markings were placed.  The CT images were loaded into the planning software.  Then the prostate bed target, pelvic lymph node target and avoidance structures including the rectum, bladder, bowel and hips were contoured.  Treatment planning then occurred.  The radiation prescription was entered and confirmed.  A total of one complex treatment devices were fabricated. I have requested : Intensity Modulated Radiotherapy (IMRT) is medically necessary for this case for the following reason:  Rectal sparing.Aaron Aas  PLAN:  The patient will receive 45 Gy in 25 fractions of 1.8 Gy, followed by a boost to the prostate and PET-Positive node to a total dose of 75 Gy with 15 additional fractions of 2  Gy.   ________________________________  Trilby Fujisawa Lorri Rota, M.D.

## 2023-05-15 DIAGNOSIS — Z510 Encounter for antineoplastic radiation therapy: Secondary | ICD-10-CM | POA: Diagnosis not present

## 2023-05-17 NOTE — Progress Notes (Signed)
 RN spoke with patient to review any questions or new barriers prior to upcoming radiation treatment.  All questions answered, no additional needs at this time.  Patient will start the daily radiation on 5/7.  RN encouraged patient to reach out with any questions or concerns that may arise.   Patient received his ADT at [facility] on 3/14, Eligard 45mg .  Has urology follow up with Dr. Cathi Cluster on 7/9.

## 2023-05-19 NOTE — Anesthesia Postprocedure Evaluation (Signed)
 Anesthesia Post Note  Patient: Nile Barton  Procedure(s) Performed: INSERTION, GOLD SEEDS (Prostate) INJECTION, HYDROGEL SPACER (Prostate)     Patient location during evaluation: PACU Anesthesia Type: MAC Level of consciousness: awake and alert Pain management: pain level controlled Vital Signs Assessment: post-procedure vital signs reviewed and stable Respiratory status: spontaneous breathing, nonlabored ventilation, respiratory function stable and patient connected to nasal cannula oxygen Cardiovascular status: stable and blood pressure returned to baseline Postop Assessment: no apparent nausea or vomiting Anesthetic complications: no   No notable events documented.  Last Vitals:  Vitals:   05/10/23 0817 05/10/23 0845  BP: (!) 151/83 (!) 154/80  Pulse: (!) 59   Resp: 14   Temp: 36.7 C   SpO2: 98% 98%    Last Pain:  Vitals:   05/10/23 0845  TempSrc:   PainSc: 0-No pain                 Marcellina Jonsson L Selene Peltzer

## 2023-05-22 ENCOUNTER — Ambulatory Visit
Admission: RE | Admit: 2023-05-22 | Discharge: 2023-05-22 | Disposition: A | Discharge status: Home / Self Care | Source: Ambulatory Visit | Attending: Radiation Oncology | Admitting: Radiation Oncology

## 2023-05-22 ENCOUNTER — Other Ambulatory Visit: Payer: Self-pay

## 2023-05-22 DIAGNOSIS — C61 Malignant neoplasm of prostate: Secondary | ICD-10-CM | POA: Diagnosis present

## 2023-05-22 DIAGNOSIS — Z510 Encounter for antineoplastic radiation therapy: Secondary | ICD-10-CM | POA: Diagnosis present

## 2023-05-22 LAB — RAD ONC ARIA SESSION SUMMARY
Course Elapsed Days: 0
Plan Fractions Treated to Date: 1
Plan Prescribed Dose Per Fraction: 1.8 Gy
Plan Total Fractions Prescribed: 25
Plan Total Prescribed Dose: 45 Gy
Reference Point Dosage Given to Date: 1.8 Gy
Reference Point Session Dosage Given: 1.8 Gy
Session Number: 1

## 2023-05-23 ENCOUNTER — Ambulatory Visit
Admission: RE | Admit: 2023-05-23 | Discharge: 2023-05-23 | Disposition: A | Discharge status: Home / Self Care | Source: Ambulatory Visit | Attending: Radiation Oncology | Admitting: Radiation Oncology

## 2023-05-23 ENCOUNTER — Other Ambulatory Visit: Payer: Self-pay

## 2023-05-23 DIAGNOSIS — Z510 Encounter for antineoplastic radiation therapy: Secondary | ICD-10-CM | POA: Diagnosis not present

## 2023-05-23 LAB — RAD ONC ARIA SESSION SUMMARY
Course Elapsed Days: 1
Plan Fractions Treated to Date: 2
Plan Prescribed Dose Per Fraction: 1.8 Gy
Plan Total Fractions Prescribed: 25
Plan Total Prescribed Dose: 45 Gy
Reference Point Dosage Given to Date: 3.6 Gy
Reference Point Session Dosage Given: 1.8 Gy
Session Number: 2

## 2023-05-24 ENCOUNTER — Ambulatory Visit
Admission: RE | Admit: 2023-05-24 | Discharge: 2023-05-24 | Disposition: A | Discharge status: Home / Self Care | Source: Ambulatory Visit | Attending: Radiation Oncology | Admitting: Radiation Oncology

## 2023-05-24 ENCOUNTER — Other Ambulatory Visit: Payer: Self-pay

## 2023-05-24 DIAGNOSIS — Z510 Encounter for antineoplastic radiation therapy: Secondary | ICD-10-CM | POA: Diagnosis not present

## 2023-05-24 LAB — RAD ONC ARIA SESSION SUMMARY
Course Elapsed Days: 2
Plan Fractions Treated to Date: 3
Plan Prescribed Dose Per Fraction: 1.8 Gy
Plan Total Fractions Prescribed: 25
Plan Total Prescribed Dose: 45 Gy
Reference Point Dosage Given to Date: 5.4 Gy
Reference Point Session Dosage Given: 1.8 Gy
Session Number: 3

## 2023-05-27 ENCOUNTER — Ambulatory Visit
Admission: RE | Admit: 2023-05-27 | Discharge: 2023-05-27 | Disposition: A | Discharge status: Home / Self Care | Source: Ambulatory Visit | Attending: Radiation Oncology

## 2023-05-27 ENCOUNTER — Other Ambulatory Visit: Payer: Self-pay

## 2023-05-27 DIAGNOSIS — Z510 Encounter for antineoplastic radiation therapy: Secondary | ICD-10-CM | POA: Diagnosis not present

## 2023-05-27 LAB — RAD ONC ARIA SESSION SUMMARY
Course Elapsed Days: 5
Plan Fractions Treated to Date: 4
Plan Prescribed Dose Per Fraction: 1.8 Gy
Plan Total Fractions Prescribed: 25
Plan Total Prescribed Dose: 45 Gy
Reference Point Dosage Given to Date: 7.2 Gy
Reference Point Session Dosage Given: 1.8 Gy
Session Number: 4

## 2023-05-28 ENCOUNTER — Ambulatory Visit
Admission: RE | Admit: 2023-05-28 | Discharge: 2023-05-28 | Disposition: A | Discharge status: Home / Self Care | Source: Ambulatory Visit | Attending: Radiation Oncology | Admitting: Radiation Oncology

## 2023-05-28 ENCOUNTER — Other Ambulatory Visit: Payer: Self-pay

## 2023-05-28 DIAGNOSIS — R531 Weakness: Secondary | ICD-10-CM | POA: Diagnosis not present

## 2023-05-28 DIAGNOSIS — N3001 Acute cystitis with hematuria: Secondary | ICD-10-CM | POA: Diagnosis not present

## 2023-05-28 LAB — RAD ONC ARIA SESSION SUMMARY
Course Elapsed Days: 6
Plan Fractions Treated to Date: 5
Plan Prescribed Dose Per Fraction: 1.8 Gy
Plan Total Fractions Prescribed: 25
Plan Total Prescribed Dose: 45 Gy
Reference Point Dosage Given to Date: 9 Gy
Reference Point Session Dosage Given: 1.8 Gy
Session Number: 5

## 2023-05-29 ENCOUNTER — Ambulatory Visit
Admission: RE | Admit: 2023-05-29 | Discharge: 2023-05-29 | Disposition: A | Discharge status: Home / Self Care | Source: Ambulatory Visit | Attending: Radiation Oncology | Admitting: Radiation Oncology

## 2023-05-29 ENCOUNTER — Other Ambulatory Visit: Payer: Self-pay

## 2023-05-29 LAB — RAD ONC ARIA SESSION SUMMARY
Course Elapsed Days: 7
Plan Fractions Treated to Date: 6
Plan Prescribed Dose Per Fraction: 1.8 Gy
Plan Total Fractions Prescribed: 25
Plan Total Prescribed Dose: 45 Gy
Reference Point Dosage Given to Date: 10.8 Gy
Reference Point Session Dosage Given: 1.8 Gy
Session Number: 6

## 2023-05-30 ENCOUNTER — Other Ambulatory Visit: Payer: Self-pay

## 2023-05-30 ENCOUNTER — Encounter (HOSPITAL_COMMUNITY): Payer: Self-pay | Admitting: Emergency Medicine

## 2023-05-30 ENCOUNTER — Inpatient Hospital Stay (HOSPITAL_COMMUNITY)
Admission: EM | Admit: 2023-05-30 | Discharge: 2023-06-02 | DRG: 690 | Disposition: A | Discharge status: Home / Self Care | Attending: Family Medicine | Admitting: Family Medicine

## 2023-05-30 ENCOUNTER — Emergency Department (HOSPITAL_COMMUNITY)

## 2023-05-30 ENCOUNTER — Ambulatory Visit
Admission: RE | Admit: 2023-05-30 | Discharge: 2023-05-30 | Discharge status: Home / Self Care | Source: Ambulatory Visit | Attending: Radiation Oncology

## 2023-05-30 DIAGNOSIS — I452 Bifascicular block: Secondary | ICD-10-CM | POA: Diagnosis present

## 2023-05-30 DIAGNOSIS — Z7952 Long term (current) use of systemic steroids: Secondary | ICD-10-CM | POA: Diagnosis not present

## 2023-05-30 DIAGNOSIS — R509 Fever, unspecified: Secondary | ICD-10-CM

## 2023-05-30 DIAGNOSIS — E7800 Pure hypercholesterolemia, unspecified: Secondary | ICD-10-CM | POA: Diagnosis present

## 2023-05-30 DIAGNOSIS — R531 Weakness: Secondary | ICD-10-CM | POA: Diagnosis not present

## 2023-05-30 DIAGNOSIS — E876 Hypokalemia: Secondary | ICD-10-CM | POA: Diagnosis not present

## 2023-05-30 DIAGNOSIS — N39498 Other specified urinary incontinence: Secondary | ICD-10-CM | POA: Diagnosis present

## 2023-05-30 DIAGNOSIS — I10 Essential (primary) hypertension: Secondary | ICD-10-CM | POA: Diagnosis present

## 2023-05-30 DIAGNOSIS — R972 Elevated prostate specific antigen [PSA]: Secondary | ICD-10-CM | POA: Diagnosis present

## 2023-05-30 DIAGNOSIS — R5383 Other fatigue: Secondary | ICD-10-CM | POA: Diagnosis present

## 2023-05-30 DIAGNOSIS — Z8546 Personal history of malignant neoplasm of prostate: Secondary | ICD-10-CM

## 2023-05-30 DIAGNOSIS — C61 Malignant neoplasm of prostate: Secondary | ICD-10-CM | POA: Diagnosis present

## 2023-05-30 DIAGNOSIS — N3001 Acute cystitis with hematuria: Principal | ICD-10-CM | POA: Diagnosis present

## 2023-05-30 DIAGNOSIS — C775 Secondary and unspecified malignant neoplasm of intrapelvic lymph nodes: Secondary | ICD-10-CM | POA: Diagnosis present

## 2023-05-30 DIAGNOSIS — N401 Enlarged prostate with lower urinary tract symptoms: Secondary | ICD-10-CM | POA: Diagnosis present

## 2023-05-30 DIAGNOSIS — Z79899 Other long term (current) drug therapy: Secondary | ICD-10-CM

## 2023-05-30 DIAGNOSIS — Z833 Family history of diabetes mellitus: Secondary | ICD-10-CM

## 2023-05-30 DIAGNOSIS — Z1152 Encounter for screening for COVID-19: Secondary | ICD-10-CM

## 2023-05-30 DIAGNOSIS — N390 Urinary tract infection, site not specified: Secondary | ICD-10-CM | POA: Diagnosis present

## 2023-05-30 DIAGNOSIS — E739 Lactose intolerance, unspecified: Secondary | ICD-10-CM | POA: Diagnosis present

## 2023-05-30 DIAGNOSIS — W19XXXA Unspecified fall, initial encounter: Secondary | ICD-10-CM | POA: Diagnosis present

## 2023-05-30 LAB — URINALYSIS, W/ REFLEX TO CULTURE (INFECTION SUSPECTED)
Bilirubin Urine: NEGATIVE
Glucose, UA: NEGATIVE mg/dL
Ketones, ur: 5 mg/dL — AB
Nitrite: POSITIVE — AB
Protein, ur: 100 mg/dL — AB
Specific Gravity, Urine: 1.017 (ref 1.005–1.030)
WBC, UA: >50 WBC/hpf (ref 0–5)
pH: 5 (ref 5.0–8.0)

## 2023-05-30 LAB — COMPREHENSIVE METABOLIC PANEL WITH GFR
ALT: 23 U/L (ref 0–44)
AST: 36 U/L (ref 15–41)
Albumin: 3.2 g/dL — ABNORMAL LOW (ref 3.5–5.0)
Alkaline Phosphatase: 57 U/L (ref 38–126)
Anion gap: 9 (ref 5–15)
BUN: 15 mg/dL (ref 8–23)
CO2: 26 mmol/L (ref 22–32)
Calcium: 8.7 mg/dL — ABNORMAL LOW (ref 8.9–10.3)
Chloride: 110 mmol/L (ref 98–111)
Creatinine, Ser: 0.9 mg/dL (ref 0.61–1.24)
GFR, Estimated: >60 mL/min (ref >60)
Glucose, Bld: 145 mg/dL — ABNORMAL HIGH (ref 70–99)
Potassium: 2.7 mmol/L — CL (ref 3.5–5.1)
Sodium: 145 mmol/L (ref 135–145)
Total Bilirubin: 0.7 mg/dL (ref 0.0–1.2)
Total Protein: 6.9 g/dL (ref 6.5–8.1)

## 2023-05-30 LAB — RAD ONC ARIA SESSION SUMMARY
Course Elapsed Days: 8
Plan Fractions Treated to Date: 7
Plan Prescribed Dose Per Fraction: 1.8 Gy
Plan Total Fractions Prescribed: 25
Plan Total Prescribed Dose: 45 Gy
Reference Point Dosage Given to Date: 12.6 Gy
Reference Point Session Dosage Given: 1.8 Gy
Session Number: 7

## 2023-05-30 LAB — CBC WITH DIFFERENTIAL/PLATELET
Abs Immature Granulocytes: 0.06 10*3/uL (ref 0.00–0.07)
Basophils Absolute: 0 10*3/uL (ref 0.0–0.1)
Basophils Relative: 0 %
Eosinophils Absolute: 0 10*3/uL (ref 0.0–0.5)
Eosinophils Relative: 0 %
HCT: 40.2 % (ref 39.0–52.0)
Hemoglobin: 12.7 g/dL — ABNORMAL LOW (ref 13.0–17.0)
Immature Granulocytes: 1 %
Lymphocytes Relative: 8 %
Lymphs Abs: 0.7 10*3/uL (ref 0.7–4.0)
MCH: 29 pg (ref 26.0–34.0)
MCHC: 31.6 g/dL (ref 30.0–36.0)
MCV: 91.8 fL (ref 80.0–100.0)
Monocytes Absolute: 0.9 10*3/uL (ref 0.1–1.0)
Monocytes Relative: 10 %
Neutro Abs: 6.8 10*3/uL (ref 1.7–7.7)
Neutrophils Relative %: 81 %
Platelets: 142 10*3/uL — ABNORMAL LOW (ref 150–400)
RBC: 4.38 MIL/uL (ref 4.22–5.81)
RDW: 13.8 % (ref 11.5–15.5)
WBC: 8.4 10*3/uL (ref 4.0–10.5)
nRBC: 0 % (ref 0.0–0.2)

## 2023-05-30 LAB — RESP PANEL BY RT-PCR (RSV, FLU A&B, COVID)  RVPGX2
Influenza A by PCR: NEGATIVE
Influenza B by PCR: NEGATIVE
Resp Syncytial Virus by PCR: NEGATIVE
SARS Coronavirus 2 by RT PCR: NEGATIVE

## 2023-05-30 LAB — PROTIME-INR
INR: 1.2 (ref 0.8–1.2)
Prothrombin Time: 15.2 s (ref 11.4–15.2)

## 2023-05-30 LAB — I-STAT CG4 LACTIC ACID, ED: Lactic Acid, Venous: 1.2 mmol/L (ref 0.5–1.9)

## 2023-05-30 MED ORDER — ONDANSETRON HCL 4 MG/2ML IJ SOLN
4.0000 mg | Freq: Four times a day (QID) | INTRAMUSCULAR | Status: DC | PRN
Start: 2023-05-30 — End: 2023-06-02

## 2023-05-30 MED ORDER — ONDANSETRON HCL 4 MG PO TABS
4.0000 mg | ORAL_TABLET | Freq: Four times a day (QID) | ORAL | Status: DC | PRN
Start: 2023-05-30 — End: 2023-06-02

## 2023-05-30 MED ORDER — VANCOMYCIN HCL 2000 MG/400ML IV SOLN
2000.0000 mg | Freq: Once | INTRAVENOUS | Status: AC
  Administered 2023-05-31: 2000 mg via INTRAVENOUS
  Filled 2023-05-30 (×2): qty 400

## 2023-05-30 MED ORDER — POTASSIUM CHLORIDE CRYS ER 20 MEQ PO TBCR
40.0000 meq | EXTENDED_RELEASE_TABLET | Freq: Once | ORAL | Status: AC
  Administered 2023-05-30: 40 meq via ORAL
  Filled 2023-05-30: qty 2

## 2023-05-30 MED ORDER — ACETAMINOPHEN 325 MG PO TABS
650.0000 mg | ORAL_TABLET | Freq: Four times a day (QID) | ORAL | Status: DC | PRN
  Administered 2023-05-31: 650 mg via ORAL
  Filled 2023-05-30 (×2): qty 2

## 2023-05-30 MED ORDER — SODIUM CHLORIDE 0.9 % IV SOLN
1.0000 g | Freq: Once | INTRAVENOUS | Status: AC
  Administered 2023-05-30: 1 g via INTRAVENOUS
  Filled 2023-05-30: qty 10

## 2023-05-30 MED ORDER — PREDNISONE 5 MG PO TABS
5.0000 mg | ORAL_TABLET | Freq: Every day | ORAL | Status: DC
  Administered 2023-05-31 – 2023-06-01 (×2): 5 mg via ORAL
  Filled 2023-05-30 (×2): qty 1

## 2023-05-30 MED ORDER — ATORVASTATIN CALCIUM 10 MG PO TABS
10.0000 mg | ORAL_TABLET | Freq: Every day | ORAL | Status: DC
  Administered 2023-05-31 – 2023-06-02 (×3): 10 mg via ORAL
  Filled 2023-05-30 (×3): qty 1

## 2023-05-30 MED ORDER — POTASSIUM CHLORIDE 10 MEQ/100ML IV SOLN
10.0000 meq | INTRAVENOUS | Status: AC
  Administered 2023-05-30 (×2): 10 meq via INTRAVENOUS
  Filled 2023-05-30 (×2): qty 100

## 2023-05-30 MED ORDER — VITAMIN D 25 MCG (1000 UNIT) PO TABS
1000.0000 [IU] | ORAL_TABLET | Freq: Every day | ORAL | Status: DC
  Administered 2023-05-31 – 2023-06-02 (×3): 1000 [IU] via ORAL
  Filled 2023-05-30 (×3): qty 1

## 2023-05-30 MED ORDER — ABIRATERONE ACETATE 250 MG PO TABS: 1000.0000 mg | ORAL_TABLET | Freq: Every morning | ORAL | Status: DC

## 2023-05-30 MED ORDER — TAMSULOSIN HCL 0.4 MG PO CAPS
0.4000 mg | ORAL_CAPSULE | Freq: Every day | ORAL | Status: DC
  Administered 2023-05-31 – 2023-06-02 (×3): 0.4 mg via ORAL
  Filled 2023-05-30 (×3): qty 1

## 2023-05-30 MED ORDER — OXYBUTYNIN CHLORIDE 5 MG PO TABS: 5.0000 mg | ORAL_TABLET | Freq: Three times a day (TID) | ORAL | Status: DC

## 2023-05-30 MED ORDER — ACETAMINOPHEN 650 MG RE SUPP: 650.0000 mg | Freq: Four times a day (QID) | RECTAL | Status: DC | PRN

## 2023-05-30 MED ORDER — ENOXAPARIN SODIUM 40 MG/0.4ML IJ SOSY
40.0000 mg | PREFILLED_SYRINGE | INTRAMUSCULAR | Status: DC
  Administered 2023-05-31 – 2023-06-02 (×3): 40 mg via SUBCUTANEOUS
  Filled 2023-05-30 (×3): qty 0.4

## 2023-05-30 MED ORDER — LACTATED RINGERS IV BOLUS
1000.0000 mL | Freq: Once | INTRAVENOUS | Status: AC
  Administered 2023-05-30: 1000 mL via INTRAVENOUS

## 2023-05-30 MED ORDER — SENNOSIDES-DOCUSATE SODIUM 8.6-50 MG PO TABS: 1.0000 | ORAL_TABLET | Freq: Every evening | ORAL | Status: DC | PRN

## 2023-05-30 MED ORDER — IRBESARTAN 150 MG PO TABS
150.0000 mg | ORAL_TABLET | Freq: Every day | ORAL | Status: DC
  Administered 2023-05-31 – 2023-06-02 (×4): 150 mg via ORAL
  Filled 2023-05-30 (×4): qty 1

## 2023-05-30 NOTE — ED Provider Notes (Signed)
 [place] EMERGENCY DEPARTMENT AT [facility] Provider Note   CSN: [id number] Arrival date & time: 05/30/23  1912     History  Chief Complaint  Patient presents with   Weakness    Chase Cook is a 78 y.o. male.  78 year old male with past medical history of prostate cancer on prednisone  and Zytiga  presenting to the emergency department today with generalized weakness.  The patient states that he is felt very weak today.  He fell this morning secondary to this.  The did not injure himself when he did.  He went to radiation therapy and was feeling very weak afterwards have difficulty getting up and getting around.  He was sent to the ER at that time for further evaluation.  He denies any fevers at home was found to be febrile here on arrival.  Denies any URI symptoms.  He does report some increased urinary frequency and urgency but this is not out of the ordinary for him with the radiation treatments for his prostate.   Weakness      Home Medications Prior to Admission medications   Medication Sig Start Date End Date Taking? Authorizing Provider  abiraterone  acetate (ZYTIGA ) 250 MG tablet TAKE 4 TABLETS (1,000MG ) BY  MOUTH ONCE DAILY ON AN EMPTY  STOMACH 1 HOUR BEFORE OR 2 HOURS AFTER A MEAL 04/19/23   Lowanda Ruddy, MD  atorvastatin (LIPITOR) 10 MG tablet Take 10 mg by mouth daily.    [provider]  betamethasone dipropionate (DIPROLENE) 0.05 % ointment Apply topically 2 (two) times daily.    [provider]  cholecalciferol (VITAMIN D3) 25 MCG (1000 UNIT) tablet Take 1,000 Units by mouth daily.    [provider]  clotrimazole (LOTRIMIN) 1 % cream Apply 1 Application topically as needed.    [provider]  hydrochlorothiazide  (HYDRODIURIL ) 25 MG tablet Take 1 tablet (25 mg total) by mouth daily. 03/28/23   Lowanda Ruddy, MD  oxybutynin (DITROPAN) 5 MG tablet Take 5 mg by mouth 3 (three) times daily.    [provider]   predniSONE  (DELTASONE ) 5 MG tablet Take 1 tablet (5 mg total) by mouth daily with breakfast. 03/21/23   Lowanda Ruddy, MD  tamsulosin (FLOMAX) 0.4 MG CAPS capsule Take 0.4 mg by mouth.    [provider]  valsartan  (DIOVAN ) 160 MG tablet TAKE 1 TABLET (160 MG TOTAL) BY MOUTH DAILY. TAKE ONE HALF TABLET FOR BLOOD PRESSURE. 04/15/23   Lowanda Ruddy, MD      Allergies    Lactose intolerance (gi)    Review of Systems   Review of Systems  Neurological:  Positive for weakness.  All other systems reviewed and are negative.   Physical Exam Updated Vital Signs BP (!) 166/79   Pulse 83   Temp (!) 101.5 F (38.6 C) (Oral)   Resp 16   Ht 5\' 6"  (1.676 m)   Wt 111 kg   SpO2 93%   BMI 39.50 kg/m  Physical Exam Vitals and nursing note reviewed.   Gen: NAD, chronically ill-appearing Eyes: PERRL, EOMI HEENT: no oropharyngeal swelling Neck: trachea midline Resp: clear to auscultation bilaterally Card: RRR, no murmurs, rubs, or gallops Abd: nontender, nondistended Extremities: no calf tenderness, no edema Vascular: 2+ radial pulses bilaterally, 2+ DP pulses bilaterally Skin: no rashes Psyc: acting appropriately   ED Results / Procedures / Treatments   Labs (all labs ordered are listed, but only abnormal results are displayed) Labs Reviewed  COMPREHENSIVE METABOLIC PANEL WITH GFR - Abnormal; Notable for the following components:      Result Value   Potassium 2.7 (*)    Glucose, Bld 145 (*)    Calcium 8.7 (*)    Albumin 3.2 (*)    All other components within normal limits  CBC WITH DIFFERENTIAL/PLATELET - Abnormal; Notable for the following components:   Hemoglobin 12.7 (*)    Platelets 142 (*)    All other components within normal limits  URINALYSIS, W/ REFLEX TO CULTURE (INFECTION SUSPECTED) - Abnormal; Notable for the following components:   Color, Urine COLORLESS (*)    Glucose, UA >=500 (*)    Hgb urine dipstick SMALL (*)    Ketones, ur 80 (*)    Leukocytes,Ua  SMALL (*)    All other components within normal limits  RESP PANEL BY RT-PCR (RSV, FLU A&B, COVID)  RVPGX2  CULTURE, BLOOD (ROUTINE X 2)  CULTURE, BLOOD (ROUTINE X 2)  PROTIME-INR  I-STAT CG4 LACTIC ACID, ED    EKG None  Radiology DG Chest Port 1 View Result Date: 05/30/2023 CLINICAL DATA:  Weakness for 1 day, prostate cancer EXAM: PORTABLE CHEST 1 VIEW COMPARISON:  02/04/2023 FINDINGS: Single frontal view of the chest demonstrates an unremarkable cardiac silhouette. No acute airspace disease, effusion, or pneumothorax. No acute bony abnormalities. IMPRESSION: 1. No acute intrathoracic process. Electronically Signed   By: Bobbye Burrow M.D.   On: 05/30/2023 20:45    Procedures Procedures    Medications Ordered in ED Medications  potassium chloride 10 mEq in 100 mL IVPB (10 mEq Intravenous New Bag/Given 05/30/23 2145)  cefTRIAXone  (ROCEPHIN ) 1 g in sodium chloride  0.9 % 100 mL IVPB (0 g Intravenous Stopped 05/30/23 2144)  lactated ringers  bolus 1,000 mL (0 mLs Intravenous Stopped 05/30/23 2150)  potassium chloride SA (KLOR-CON M) CR tablet 40 mEq (40 mEq Oral Given 05/30/23 2058)  lactated ringers  bolus 1,000 mL (1,000 mLs Intravenous New Bag/Given 05/30/23 2058)    ED Course/ Medical Decision Making/ A&P                                 Medical Decision Making 78 year old male with past medical history of prostate cancer who is on oral chemotherapy as well as prednisone  daily presenting to the emergency department today with fever and generalized weakness.  Initiate a sepsis workup on the patient.  Will cover him with Rocephin  for likely urine infection.  Will broaden coverage if this is unremarkable.  His abdominal exam is reassuring.  He denies any URI symptoms.  He is somewhat immunocompromise with the chronic steroids.  The patient's potassium is low at 2.7.  This is repleted with oral and IV potassium.  His sepsis workup is largely unrevealing.  Given the generalized weakness and  with him being immunocompromise calls placed to hospital service for admission.  Amount and/or Complexity of Data Reviewed Labs: ordered. Radiology: ordered.  Risk Prescription drug management.           Final Clinical Impression(s) / ED Diagnoses Final diagnoses:  Generalized weakness  Fever, unspecified fever cause  Current chronic use of systemic steroids    Rx / DC Orders ED Discharge Orders     None         Carin Charleston, MD 05/30/23 2210

## 2023-05-30 NOTE — ED Triage Notes (Addendum)
 Patient BIB EMS from home c/o weakness x 1 day. Per report worsening weakness this afternoon after radiation treatment this morning. Patient denies N/V.  Hx Prostate Ca. BP 190/90 HR 84 RR 16 O2sat  95% on RA CBG 142

## 2023-05-31 ENCOUNTER — Ambulatory Visit

## 2023-05-31 DIAGNOSIS — N39498 Other specified urinary incontinence: Secondary | ICD-10-CM | POA: Diagnosis present

## 2023-05-31 DIAGNOSIS — E7800 Pure hypercholesterolemia, unspecified: Secondary | ICD-10-CM | POA: Diagnosis present

## 2023-05-31 DIAGNOSIS — Z7952 Long term (current) use of systemic steroids: Secondary | ICD-10-CM

## 2023-05-31 DIAGNOSIS — C61 Malignant neoplasm of prostate: Secondary | ICD-10-CM | POA: Diagnosis present

## 2023-05-31 DIAGNOSIS — R972 Elevated prostate specific antigen [PSA]: Secondary | ICD-10-CM | POA: Diagnosis present

## 2023-05-31 DIAGNOSIS — N3001 Acute cystitis with hematuria: Secondary | ICD-10-CM | POA: Diagnosis present

## 2023-05-31 DIAGNOSIS — W19XXXA Unspecified fall, initial encounter: Secondary | ICD-10-CM | POA: Diagnosis present

## 2023-05-31 DIAGNOSIS — R531 Weakness: Secondary | ICD-10-CM | POA: Diagnosis present

## 2023-05-31 DIAGNOSIS — E876 Hypokalemia: Secondary | ICD-10-CM | POA: Diagnosis present

## 2023-05-31 DIAGNOSIS — E739 Lactose intolerance, unspecified: Secondary | ICD-10-CM | POA: Diagnosis present

## 2023-05-31 DIAGNOSIS — N401 Enlarged prostate with lower urinary tract symptoms: Secondary | ICD-10-CM | POA: Diagnosis present

## 2023-05-31 DIAGNOSIS — Z833 Family history of diabetes mellitus: Secondary | ICD-10-CM | POA: Diagnosis not present

## 2023-05-31 DIAGNOSIS — R5383 Other fatigue: Secondary | ICD-10-CM | POA: Diagnosis present

## 2023-05-31 DIAGNOSIS — Z1152 Encounter for screening for COVID-19: Secondary | ICD-10-CM | POA: Diagnosis not present

## 2023-05-31 DIAGNOSIS — N390 Urinary tract infection, site not specified: Secondary | ICD-10-CM | POA: Diagnosis present

## 2023-05-31 DIAGNOSIS — I452 Bifascicular block: Secondary | ICD-10-CM | POA: Diagnosis present

## 2023-05-31 DIAGNOSIS — I10 Essential (primary) hypertension: Secondary | ICD-10-CM | POA: Diagnosis present

## 2023-05-31 DIAGNOSIS — C775 Secondary and unspecified malignant neoplasm of intrapelvic lymph nodes: Secondary | ICD-10-CM | POA: Diagnosis present

## 2023-05-31 DIAGNOSIS — Z79899 Other long term (current) drug therapy: Secondary | ICD-10-CM | POA: Diagnosis not present

## 2023-05-31 DIAGNOSIS — Z8546 Personal history of malignant neoplasm of prostate: Secondary | ICD-10-CM | POA: Diagnosis not present

## 2023-05-31 LAB — URINE CULTURE

## 2023-05-31 LAB — BASIC METABOLIC PANEL WITH GFR
Anion gap: 6 (ref 5–15)
Anion gap: 9 (ref 5–15)
BUN: 14 mg/dL (ref 8–23)
BUN: 14 mg/dL (ref 8–23)
CO2: 26 mmol/L (ref 22–32)
CO2: 26 mmol/L (ref 22–32)
Calcium: 8.1 mg/dL — ABNORMAL LOW (ref 8.9–10.3)
Calcium: 8.7 mg/dL — ABNORMAL LOW (ref 8.9–10.3)
Chloride: 108 mmol/L (ref 98–111)
Chloride: 110 mmol/L (ref 98–111)
Creatinine, Ser: 1.06 mg/dL (ref 0.61–1.24)
Creatinine, Ser: 1.1 mg/dL (ref 0.61–1.24)
GFR, Estimated: >60 mL/min (ref >60)
GFR, Estimated: >60 mL/min (ref >60)
Glucose, Bld: 150 mg/dL — ABNORMAL HIGH (ref 70–99)
Glucose, Bld: 146 mg/dL — ABNORMAL HIGH (ref 70–99)
Potassium: 2.8 mmol/L — ABNORMAL LOW (ref 3.5–5.1)
Potassium: 3.1 mmol/L — ABNORMAL LOW (ref 3.5–5.1)
Sodium: 140 mmol/L (ref 135–145)
Sodium: 145 mmol/L (ref 135–145)

## 2023-05-31 LAB — CBC
HCT: 37.2 % — ABNORMAL LOW (ref 39.0–52.0)
Hemoglobin: 11.7 g/dL — ABNORMAL LOW (ref 13.0–17.0)
MCH: 29.3 pg (ref 26.0–34.0)
MCHC: 31.5 g/dL (ref 30.0–36.0)
MCV: 93 fL (ref 80.0–100.0)
Platelets: 125 10*3/uL — ABNORMAL LOW (ref 150–400)
RBC: 4 MIL/uL — ABNORMAL LOW (ref 4.22–5.81)
RDW: 13.9 % (ref 11.5–15.5)
WBC: 6.1 10*3/uL (ref 4.0–10.5)
nRBC: 0 % (ref 0.0–0.2)

## 2023-05-31 LAB — PHOSPHORUS
Phosphorus: 2.1 mg/dL — ABNORMAL LOW (ref 2.5–4.6)
Phosphorus: 1.5 mg/dL — ABNORMAL LOW (ref 2.5–4.6)

## 2023-05-31 LAB — MAGNESIUM
Magnesium: 1.9 mg/dL (ref 1.7–2.4)
Magnesium: 2 mg/dL (ref 1.7–2.4)

## 2023-05-31 LAB — VITAMIN D 25 HYDROXY (VIT D DEFICIENCY, FRACTURES): Vit D, 25-Hydroxy: 16.01 ng/mL — ABNORMAL LOW (ref 30–100)

## 2023-05-31 MED ORDER — HYDRALAZINE HCL 20 MG/ML IJ SOLN
10.0000 mg | Freq: Four times a day (QID) | INTRAMUSCULAR | Status: DC | PRN
  Administered 2023-05-31: 10 mg via INTRAVENOUS
  Filled 2023-05-31: qty 1

## 2023-05-31 MED ORDER — K PHOS MONO-SOD PHOS DI & MONO 155-852-130 MG PO TABS
500.0000 mg | ORAL_TABLET | Freq: Once | ORAL | Status: AC
  Administered 2023-05-31: 500 mg via ORAL
  Filled 2023-05-31: qty 2

## 2023-05-31 MED ORDER — SODIUM CHLORIDE 0.9 % IV SOLN
1.0000 g | INTRAVENOUS | Status: DC
  Administered 2023-05-31 – 2023-06-01 (×2): 1 g via INTRAVENOUS
  Filled 2023-05-31 (×2): qty 10

## 2023-05-31 MED ORDER — POTASSIUM CHLORIDE CRYS ER 20 MEQ PO TBCR
40.0000 meq | EXTENDED_RELEASE_TABLET | Freq: Once | ORAL | Status: AC
  Administered 2023-05-31: 40 meq via ORAL
  Filled 2023-05-31: qty 2

## 2023-05-31 NOTE — Plan of Care (Signed)

## 2023-05-31 NOTE — Progress Notes (Signed)
 PROGRESS NOTE  Chase Cook  GNF:[id number] DOB: 09-29-1945 DOA: 05/30/2023 PCP: Doak Free, MD  Consultants  Brief Narrative: 78 y.o. male with medical history significant for prostate cancer s/p gold seed implant currently on Zytiga  and prednisone  and undergoing daily radiation therapy, HTN, HLD and urinary incontinence who presented to the ED for evaluation of generalized weakness.  Also with fall.  This morning, he was able to undergo his daily radiation therapy but while at home, he fell again in his bedroom and was unable to get up.  Brought to the emergency department for the same.  In ED found to have temp of 101.5 and UA positive for UTI.  Admitted for the same plus weakness  Assessment & Plan: Generalized weakness # Falls - Cancer patient currently undergoing radiation therapy with 2 days of worsening weakness and falls in the setting of acute urinary tract infection and hypokalemia -  PT/OT eval and treat - Check vitamin D levels - Fall precautions   # Acute cystitis - History of urinary incontinence with recent burning with urination - Patient febrile on admission, UA showing evidence of infection - Continue IV Rocephin  - Follow-up urine culture and blood culture   # Prostate cancer - Status post gold seed implant and currently undergoing daily radiation therapy - Appreciate Dr. Alita Irwin coming by to see patient earlier today.  Holding abiraterone  and prednisone    # Hypokalemia - K+ 2.7 on admission, replacing - Follow-up morning BMP, mag and Phos - Hold home HCTZ for now  Hypophosphatemia: - Adding K-Phos to help with potassium as well.   # HTN - Downtrending since admission. - Continue valsartan  (irbesartan as substitute) - IV hydralazine 10 mg as needed for SBP >180   # HLD - Continue atorvastatin   # Urinary incontinence # BPH - Continue tamsulosin and oxybutynin    DVT prophylaxis:  enoxaparin (LOVENOX) injection 40 mg Start: 05/31/23  1000  Code Status:   Code Status: Full Code Level of care: Med-Surg Status is: Inpatient Remains inpatient appropriate because: UTI   Subjective: Patient still feels weak.  Not much of an appetite.  Still with dysuria.  No abdominal pain.  No other complaints or concerns.  Objective: Vitals:   05/31/23 0302 05/31/23 0414 05/31/23 0815 05/31/23 1338  BP: 127/61 (!) 140/61 (!) 148/72 (!) 149/54  Pulse: 68 73 72 91  Resp:  18 18 17   Temp:  98.6 F (37 C) 99.1 F (37.3 C) 99.4 F (37.4 C)  TempSrc:  Oral Oral Oral  SpO2:  94% 95% 97%  Weight:      Height:        Intake/Output Summary (Last 24 hours) at 05/31/2023 1709 Last data filed at 05/31/2023 0500 Gross per 24 hour  Intake 2406.67 ml  Output --  Net 2406.67 ml   Filed Weights   05/30/23 1959  Weight: 111 kg   Body mass index is 39.5 kg/m.  Gen: 78 y.o. male in no apparent distress.  Nontoxic Pulm: Non-labored breathing.  Clear to auscultation bilaterally.  Distant breath sounds CV: Regular rate and rhythm.  GI: Abdomen soft, non-tender, non-distended, with normoactive bowel sounds. No organomegaly or masses felt. Ext: Warm, no deformities Skin: No rashes, lesions no ulcers Neuro: Alert and oriented. No focal neurological deficits. Psych: Calm  Judgement and insight appear normal. Mood & affect appropriate.     I have personally reviewed the following labs and images: CBC: Recent Labs  Lab 05/30/23 1950 05/31/23 0459  WBC  8.4 6.1  NEUTROABS 6.8  --   HGB 12.7* 11.7*  HCT 40.2 37.2*  MCV 91.8 93.0  PLT 142* 125*   BMP &GFR Recent Labs  Lab 05/30/23 1950 05/31/23 0459 05/31/23 1255  NA 145 140 145  K 2.7* 2.8* 3.1*  CL 110 108 110  CO2 26 26 26   GLUCOSE 145* 150* 146*  BUN 15 14 14   CREATININE 0.90 1.06 1.10  CALCIUM 8.7* 8.1* 8.7*  MG  --  1.9 2.0  PHOS  --  2.1* 1.5*   Estimated Creatinine Clearance: 65.8 mL/min (by C-G formula based on SCr of 1.1 mg/dL). Liver & Pancreas: Recent Labs   Lab 05/30/23 1950  AST 36  ALT 23  ALKPHOS 57  BILITOT 0.7  PROT 6.9  ALBUMIN 3.2*   No results for input(s): "LIPASE", "AMYLASE" in the last 168 hours. No results for input(s): "AMMONIA" in the last 168 hours. Diabetic: No results for input(s): "HGBA1C" in the last 72 hours. No results for input(s): "GLUCAP" in the last 168 hours. Cardiac Enzymes: No results for input(s): "CKTOTAL", "CKMB", "CKMBINDEX", "TROPONINI" in the last 168 hours. No results for input(s): "PROBNP" in the last 8760 hours. Coagulation Profile: Recent Labs  Lab 05/30/23 2020  INR 1.2   Thyroid Function Tests: No results for input(s): "TSH", "T4TOTAL", "FREET4", "T3FREE", "THYROIDAB" in the last 72 hours. Lipid Profile: No results for input(s): "CHOL", "HDL", "LDLCALC", "TRIG", "CHOLHDL", "LDLDIRECT" in the last 72 hours. Anemia Panel: No results for input(s): "VITAMINB12", "FOLATE", "FERRITIN", "TIBC", "IRON", "RETICCTPCT" in the last 72 hours. Urine analysis:    Component Value Date/Time   COLORURINE YELLOW 05/30/2023 2037   APPEARANCEUR CLOUDY (A) 05/30/2023 2037   LABSPEC 1.017 05/30/2023 2037   PHURINE 5.0 05/30/2023 2037   GLUCOSEU NEGATIVE 05/30/2023 2037   HGBUR LARGE (A) 05/30/2023 2037   BILIRUBINUR NEGATIVE 05/30/2023 2037   KETONESUR 5 (A) 05/30/2023 2037   PROTEINUR 100 (A) 05/30/2023 2037   UROBILINOGEN 1.0 06/30/2014 2140   NITRITE POSITIVE (A) 05/30/2023 2037   LEUKOCYTESUR LARGE (A) 05/30/2023 2037   Sepsis Labs: Invalid input(s): "PROCALCITONIN", "LACTICIDVEN"  Microbiology: Recent Results (from the past 240 hours)  Resp panel by RT-PCR (RSV, Flu A&B, Covid) Anterior Nasal Swab     Status: None   Collection Time: 05/30/23  7:42 PM   Specimen: Anterior Nasal Swab  Result Value Ref Range Status   SARS Coronavirus 2 by RT PCR NEGATIVE NEGATIVE Final    Comment: (NOTE) SARS-CoV-2 target nucleic acids are NOT DETECTED.  The SARS-CoV-2 RNA is generally detectable in upper  respiratory specimens during the acute phase of infection. The lowest concentration of SARS-CoV-2 viral copies this assay can detect is 138 copies/mL. A negative result does not preclude SARS-Cov-2 infection and should not be used as the sole basis for treatment or other patient management decisions. A negative result may occur with  improper specimen collection/handling, submission of specimen other than nasopharyngeal swab, presence of viral mutation(s) within the areas targeted by this assay, and inadequate number of viral copies(<138 copies/mL). A negative result must be combined with clinical observations, patient history, and epidemiological information. The expected result is Negative.  Fact Sheet for Patients:  BloggerCourse.com  Fact Sheet for Healthcare Providers:  SeriousBroker.it  This test is no t yet approved or cleared by the [place]  FDA and  has been authorized for detection and/or diagnosis of SARS-CoV-2 by FDA under an Emergency Use Authorization (EUA). This EUA will remain  in effect (meaning  this test can be used) for the duration of the COVID-19 declaration under Section 564(b)(1) of the Act, 21 U.S.C.section 360bbb-3(b)(1), unless the authorization is terminated  or revoked sooner.       Influenza A by PCR NEGATIVE NEGATIVE Final   Influenza B by PCR NEGATIVE NEGATIVE Final    Comment: (NOTE) The Xpert Xpress SARS-CoV-2/FLU/RSV plus assay is intended as an aid in the diagnosis of influenza from Nasopharyngeal swab specimens and should not be used as a sole basis for treatment. Nasal washings and aspirates are unacceptable for Xpert Xpress SARS-CoV-2/FLU/RSV testing.  Fact Sheet for Patients: BloggerCourse.com  Fact Sheet for Healthcare Providers: SeriousBroker.it  This test is not yet approved or cleared by the [place]  FDA and has been  authorized for detection and/or diagnosis of SARS-CoV-2 by FDA under an Emergency Use Authorization (EUA). This EUA will remain in effect (meaning this test can be used) for the duration of the COVID-19 declaration under Section 564(b)(1) of the Act, 21 U.S.C. section 360bbb-3(b)(1), unless the authorization is terminated or revoked.     Resp Syncytial Virus by PCR NEGATIVE NEGATIVE Final    Comment: (NOTE) Fact Sheet for Patients: BloggerCourse.com  Fact Sheet for Healthcare Providers: SeriousBroker.it  This test is not yet approved or cleared by the [place]  FDA and has been authorized for detection and/or diagnosis of SARS-CoV-2 by FDA under an Emergency Use Authorization (EUA). This EUA will remain in effect (meaning this test can be used) for the duration of the COVID-19 declaration under Section 564(b)(1) of the Act, 21 U.S.C. section 360bbb-3(b)(1), unless the authorization is terminated or revoked.  Performed at [facility], [address], [place], [place] [address]   Blood Culture (routine x 2)     Status: None (Preliminary result)   Collection Time: 05/30/23  7:50 PM   Specimen: BLOOD RIGHT HAND  Result Value Ref Range Status   Specimen Description   Final    BLOOD RIGHT HAND Performed at [facility], [address] [place], [place] [address]    Special Requests   Final    BOTTLES DRAWN AEROBIC ONLY Blood Culture results may not be optimal due to an inadequate volume of blood received in culture bottles Performed at [facility], [address] [place], [place] [address]    Culture   Final    NO GROWTH < 12 HOURS Performed at [facility], [address], [place], [place] [address]    Report Status PENDING  Incomplete  Blood Culture (routine x 2)     Status: None (Preliminary result)   Collection Time: 05/30/23  7:50 PM   Specimen: BLOOD  Result Value  Ref Range Status   Specimen Description   Final    BLOOD LEFT ANTECUBITAL Performed at [facility], [address] [place], [place] [address]    Special Requests   Final    BOTTLES DRAWN AEROBIC AND ANAEROBIC Blood Culture adequate volume Performed at [facility], [address], [place], [place] [address]    Culture   Final    NO GROWTH < 12 HOURS Performed at [facility], [address], [place], [place] [address]    Report Status PENDING  Incomplete    Radiology Studies: DG Chest Port 1 View Result Date: 05/30/2023 CLINICAL DATA:  Weakness for 1 day, prostate cancer EXAM: PORTABLE CHEST 1 VIEW COMPARISON:  02/04/2023 FINDINGS: Single frontal view of the chest  demonstrates an unremarkable cardiac silhouette. No acute airspace disease, effusion, or pneumothorax. No acute bony abnormalities. IMPRESSION: 1. No acute intrathoracic process. Electronically Signed   By: Bobbye Burrow M.D.   On: 05/30/2023 20:45    Scheduled Meds:  atorvastatin  10 mg Oral Daily   cholecalciferol  1,000 Units Oral Daily   enoxaparin (LOVENOX) injection  40 mg Subcutaneous Q24H   irbesartan  150 mg Oral Daily   predniSONE   5 mg Oral Q breakfast   tamsulosin  0.4 mg Oral Daily   Continuous Infusions:  cefTRIAXone  (ROCEPHIN )  IV       LOS: 0 days   35 minutes with more than 50% spent in reviewing records, counseling patient/family and coordinating care.  Trenton Frock, MD [facility] [email or web address] 05/31/2023, 5:09 PM

## 2023-05-31 NOTE — Assessment & Plan Note (Signed)
 Replete electrolytes Hold abiraterone  and prednisone  Increase fluid intake, activities as able

## 2023-05-31 NOTE — Progress Notes (Signed)
 PRN hydralazine was given. Vital signs was rechecked. BP = 140/61 at this time.PO Avapro was given at 0400. Will monitor patient closely.

## 2023-05-31 NOTE — Assessment & Plan Note (Signed)
 Grade 3 hypokalemia. Will continue replacement per primary team

## 2023-05-31 NOTE — Progress Notes (Signed)
 Prior-To-Admission Oral Chemotherapy for Treatment of Oncologic Disease   Order noted from Dr. Yvonne Hering  to continue prior-to-admission oral chemotherapy regimen of Zytiga .  Procedure Per Pharmacy & Therapeutics Committee Policy: Orders for continuation of home oral chemotherapy for treatment of an oncologic disease will be held unless approved by an oncologist during current admission.    For patients receiving oncology care at [facility], inpatient pharmacist contacts patient's oncologist during regular office hours to review. Contacted Dr. Alita Irwin If earlier review is medically necessary, attending physician consults [facility] on-call oncologist  Oral chemotherapy continuation order is on hold pending oncologist review, [facility] oncologist Dr Brendon Caller will be notified by inpatient pharmacy during office hours  5/16: Dr. Raechel Bulla said to hold currently   Chase Cook, PharmD, BCPS Clinical Staff Pharmacist  Chase Cook 05/31/2023, 8:55 AM

## 2023-05-31 NOTE — Evaluation (Signed)
 Physical Therapy Evaluation Patient Details Name: Chase Cook MRN: [id number] DOB: September 15, 1945 Today's Date: 05/31/2023  History of Present Illness  Pt is 78 yo male who presented on 05/30/23 with generalized weakness and 2 recent falls. Pt with hypokalemia and acute cystitis.  Pt with hx including but not limited to prostate CA s/p gold seed implant currently on Zytiga  and prednisone  and undergoing daily radiation therapy, HTN, HLD, BPH and urinary incontinence  Clinical Impression  Pt admitted with above diagnosis. At baseline, pt is independent.  He is typically able to ambulate in community without AD.  Reports weaker with 2 falls since Wednesday.  States falls were at night and that he just remembers waking up on the floor.  Does not recall passing out, tripping, or getting weak, just waking up on the floor.  Today, pt reports feeling much better.  He was able to ambulate 100' with RW and CGA for safety.  Considering pt just recalls waking up on floor - did check orthostatic vitals and they were negative.  Pt expected to progress well with therapy and likely no PT needs at d/c.   Pt currently with functional limitations due to the deficits listed below (see PT Problem List). Pt will benefit from acute skilled PT to increase their independence and safety with mobility to allow discharge.       Orthostatic BP (negative) Supine 161/79 Sitting 147/78 Standing 141/80 Standing 3 mins 160/78  HR 90's-108 bpm    If plan is discharge home, recommend the following:     Can travel by private vehicle        Equipment Recommendations None recommended by PT  Recommendations for Other Services       Functional Status Assessment Patient has had a recent decline in their functional status and demonstrates the ability to make significant improvements in function in a reasonable and predictable amount of time.     Precautions / Restrictions Precautions Precautions: Fall      Mobility  Bed  Mobility Overal bed mobility: Independent             General bed mobility comments: supine/sit without difficulty from flat bed    Transfers Overall transfer level: Needs assistance Equipment used: Rolling walker (2 wheels) Transfers: Sit to/from Stand Sit to Stand: Supervision           General transfer comment: cues for hand palcement    Ambulation/Gait Ambulation/Gait assistance: Contact guard assist Gait Distance (Feet): 100 Feet Assistive device: Rolling walker (2 wheels) Gait Pattern/deviations: Step-through pattern, Wide base of support Gait velocity: decreased but functional     General Gait Details: pt initially carrying RW - cues to push; tolerated well; no dizziness/lightheadedness  Stairs            Wheelchair Mobility     Tilt Bed    Modified Rankin (Stroke Patients Only)       Balance Overall balance assessment: Needs assistance Sitting-balance support: No upper extremity supported Sitting balance-Leahy Scale: Good     Standing balance support: No upper extremity supported Standing balance-Leahy Scale: Fair Standing balance comment: Used RW for safety but could ambulate without AD                             Pertinent Vitals/Pain Pain Assessment Pain Assessment: No/denies pain    Home Living Family/patient expects to be discharged to:: Private residence Living Arrangements: Spouse/significant other Available Help at Discharge: Family;Available  24 hours/day Type of Home: House Home Access: Stairs to enter Entrance Stairs-Rails: None Entrance Stairs-Number of Steps: 4   Home Layout: One level Home Equipment: Agricultural consultant (2 wheels)      Prior Function               Mobility Comments: 2 falls prior to admission but otherwise no falls; Prior to past few days able to ambulate in community without AD; Last few days weak , using RW, and 2 falls ADLs Comments: independent with adls and iadls      Extremity/Trunk Assessment   Upper Extremity Assessment Upper Extremity Assessment: Overall WFL for tasks assessed    Lower Extremity Assessment Lower Extremity Assessment: Overall WFL for tasks assessed    Cervical / Trunk Assessment Cervical / Trunk Assessment: Normal  Communication        Cognition Arousal: Alert Behavior During Therapy: WFL for tasks assessed/performed   PT - Cognitive impairments: No apparent impairments                                 Cueing       General Comments      Exercises     Assessment/Plan    PT Assessment Patient needs continued PT services  PT Problem List Decreased strength;Pain;Decreased range of motion;Decreased activity tolerance;Decreased balance;Decreased mobility;Decreased knowledge of use of DME       PT Treatment Interventions DME instruction;Therapeutic exercise;Gait training;Balance training;Stair training;Functional mobility training;Therapeutic activities;Patient/family education    PT Goals (Current goals can be found in the Care Plan section)  Acute Rehab PT Goals Patient Stated Goal: return home PT Goal Formulation: With patient Time For Goal Achievement: 06/14/23 Potential to Achieve Goals: Good    Frequency Min 1X/week     Co-evaluation               AM-PAC PT "6 Clicks" Mobility  Outcome Measure Help needed turning from your back to your side while in a flat bed without using bedrails?: None Help needed moving from lying on your back to sitting on the side of a flat bed without using bedrails?: None Help needed moving to and from a bed to a chair (including a wheelchair)?: A Little Help needed standing up from a chair using your arms (e.g., wheelchair or bedside chair)?: A Little Help needed to walk in hospital room?: A Little Help needed climbing 3-5 steps with a railing? : A Little 6 Click Score: 20    End of Session Equipment Utilized During Treatment: Gait belt Activity  Tolerance: Patient tolerated treatment well Patient left: in chair;with chair alarm set;with call bell/phone within reach Nurse Communication: Mobility status PT Visit Diagnosis: Other abnormalities of gait and mobility (R26.89);Muscle weakness (generalized) (M62.81)    Time: 1610-9604 PT Time Calculation (min) (ACUTE ONLY): 29 min   Charges:   PT Evaluation $PT Eval Low Complexity: 1 Low PT Treatments $Gait Training: 8-22 mins PT General Charges $$ ACUTE PT VISIT: 1 Visit         Cyd Dowse, PT Acute Rehab [facility] [phone]   Carolynn Citrin 05/31/2023, 12:56 PM

## 2023-05-31 NOTE — Consult Note (Signed)
 [facility] Hematology and oncology consult note   Patient Care Team: Doak Free, MD as PCP - General (Internal Medicine) Katheleen Palmer, RN as Oncology Nurse Navigator   ASSESSMENT & PLAN:  78 y.o.male with past medical history of HTN, HLD, prostate cancer presented with weakness, hypokalemia. He is currently on AAP with radiation. He presented with fall, weakness and found UTI and hypokalemia.   Discussed holding AAP for now with profound weakness and hypokalemia. Will reassess after recovered from acute illness.  Assessment & Plan Generalized weakness Replete electrolytes Hold abiraterone  and prednisone  Increase fluid intake, activities as able Acute cystitis with hematuria Agree with antibiotics per primary team Hypokalemia Grade 3 hypokalemia. Will continue replacement per primary team  Will follow up on Monday. If discharged will call for follow up next week.  Discharge planning If discharged over the weekend, will follow up in clinic next week.  All questions were answered.    Lowanda Ruddy, MD 05/31/2023 4:52 PM   CHIEF COMPLAINTS/PURPOSE OF ADMISSION weakness  HISTORY OF PRESENTING ILLNESS:  Chase Cook 78 y.o. male consulted for prostate cancer. Patient is admitted for weakness, hypokalemia, cystitis. Overall feeling weak and resulted in fall. He is feeling better today. He has dysuria. No diarrhea or stomach pain, chest pain, coughing or short of breath.  Summary of oncologic history as follows: Oncology History  Malignant neoplasm of prostate (HCC)  01/04/2023 Pathology Results   MRI fusion biopsy  14/15 core biopsies positive.  The maximum Gleason score was 4+5 and multiple 4+4, and 4+3 prostate adenocarcinoma   02/04/2023 PET scan   PSMA PET 1. Intense radiotracer activity within the prostate gland consistent primary prostate adenocarcinoma. 2. Metastatic lymph node posterior RIGHT of the rectum as described above. Lymph  nodes: Radiotracer avid lymph node in the mesorectal sheath posterior RIGHT of the rectum measuring 8 mm on image 168 with SUV max equal 11.7. 3. No evidence of metastatic adenopathy outside the pelvis. 4. No visceral metastasis or skeletal metastasis.   02/25/2023 -  Chemotherapy   started on ADT with Firmagon on 02/25/23.    02/26/2023 Initial Diagnosis   Malignant neoplasm of prostate (HCC)   02/26/2023 Cancer Staging   Staging form: Prostate, AJCC 8th Edition - Clinical stage from 02/26/2023: Stage IVA (cT1c, cN1, cM0, PSA: 18.7, Grade Group: 5) - Signed by Kenith Payer, MD on 02/26/2023 Histopathologic type: Adenocarcinoma, NOS Stage prefix: Initial diagnosis Prostate specific antigen (PSA) range: 10 to 19 Gleason primary pattern: 4 Gleason secondary pattern: 5 Gleason score: 9 Histologic grading system: 5 grade system     MEDICAL HISTORY:  Past Medical History:  Diagnosis Date   Cancer (HCC)    Prostate   Elevated PSA    High cholesterol    Hypertension     SURGICAL HISTORY: Past Surgical History:  Procedure Laterality Date   GOLD SEED IMPLANT N/A 05/10/2023   Procedure: INSERTION, GOLD SEEDS;  Surgeon: Roxane Copp, MD;  Location: [facility] OR;  Service: Urology;  Laterality: N/A;   PROSTATE BIOPSY     SPACE OAR INSTILLATION N/A 05/10/2023   Procedure: INJECTION, HYDROGEL SPACER;  Surgeon: Roxane Copp, MD;  Location: [facility] OR;  Service: Urology;  Laterality: N/A;    SOCIAL HISTORY: Social History   Socioeconomic History   Marital status: Married    Spouse name: Not on file   Number of children: Not on file   Years of education: Not on file   Highest education level: Not  on file  Occupational History   Not on file  Tobacco Use   Smoking status: Never   Smokeless tobacco: Never  Vaping Use   Vaping status: Never Used  Substance and Sexual Activity   Alcohol use: No   Drug use: No   Sexual activity: Not Currently  Other Topics Concern   Not on file   Social History Narrative   Not on file   Social Drivers of Health   Financial Resource Strain: Not on file  Food Insecurity: No Food Insecurity (05/31/2023)   Hunger Vital Sign    Worried About Running Out of Food in the Last Year: Never true    Ran Out of Food in the Last Year: Never true  Transportation Needs: No Transportation Needs (05/31/2023)   PRAPARE - Administrator, Civil Service (Medical): No    Lack of Transportation (Non-Medical): No  Physical Activity: Not on file  Stress: Not on file  Social Connections: Socially Integrated (05/31/2023)   Social Connection and Isolation Panel [NHANES]    Frequency of Communication with Friends and Family: More than three times a week    Frequency of Social Gatherings with Friends and Family: More than three times a week    Attends Religious Services: More than 4 times per year    Active Member of [employer] or Organizations: Yes    Attends Engineer, structural: More than 4 times per year    Marital Status: Married  Catering manager Violence: Not At Risk (05/31/2023)   Humiliation, Afraid, Rape, and Kick questionnaire    Fear of Current or Ex-Partner: No    Emotionally Abused: No    Physically Abused: No    Sexually Abused: No    FAMILY HISTORY: Family History  Problem Relation Age of Onset   Diabetes Brother     ALLERGIES:  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  MEDICATIONS:  Current Facility-Administered Medications  Medication Dose Route Frequency Provider Last Rate Last Admin   acetaminophen  (TYLENOL ) tablet 650 mg  650 mg Oral Q6H PRN Amponsah, Prosper M, MD   650 mg at 05/31/23 0101   Or   acetaminophen  (TYLENOL ) suppository 650 mg  650 mg Rectal Q6H PRN Vita Grip, MD       atorvastatin (LIPITOR) tablet 10 mg  10 mg Oral Daily Amponsah, Prosper M, MD   10 mg at 05/31/23 8295   cefTRIAXone  (ROCEPHIN ) 1 g in sodium chloride  0.9 % 100 mL IVPB  1 g Intravenous Q24H Amponsah, Prosper M, MD        cholecalciferol (VITAMIN D3) 25 MCG (1000 UNIT) tablet 1,000 Units  1,000 Units Oral Daily Amponsah, Prosper M, MD   1,000 Units at 05/31/23 0952   enoxaparin (LOVENOX) injection 40 mg  40 mg Subcutaneous Q24H Amponsah, Prosper M, MD   40 mg at 05/31/23 6213   hydrALAZINE (APRESOLINE) injection 10 mg  10 mg Intravenous Q6H PRN Amponsah, Prosper M, MD   10 mg at 05/31/23 0158   irbesartan (AVAPRO) tablet 150 mg  150 mg Oral Daily Amponsah, Prosper M, MD   150 mg at 05/31/23 1000   ondansetron  (ZOFRAN ) tablet 4 mg  4 mg Oral Q6H PRN Amponsah, Prosper M, MD       Or   ondansetron  (ZOFRAN ) injection 4 mg  4 mg Intravenous Q6H PRN Amponsah, Prosper M, MD       predniSONE  (DELTASONE ) tablet 5 mg  5 mg Oral Q breakfast Amponsah, Prosper M,  MD   5 mg at 05/31/23 0800   senna-docusate (Senokot-S) tablet 1 tablet  1 tablet Oral QHS PRN Vita Grip, MD       tamsulosin (FLOMAX) capsule 0.4 mg  0.4 mg Oral Daily Vita Grip, MD   0.4 mg at 05/31/23 1610    REVIEW OF SYSTEMS:   As above   PHYSICAL EXAMINATION: ECOG PERFORMANCE STATUS: 2 - Symptomatic, <50% confined to bed  Vitals:   05/31/23 0815 05/31/23 1338  BP: (!) 148/72 (!) 149/54  Pulse: 72 91  Resp: 18 17  Temp: 99.1 F (37.3 C) 99.4 F (37.4 C)  SpO2: 95% 97%   Filed Weights   05/30/23 1959  Weight: 244 lb 11.4 oz (111 kg)    GENERAL:alert, no distress and comfortable SKIN: skin color normal. No jaundice EYES: sclera clear OROPHARYNX: no exudate, dry LYMPH:  no palpable cervical lymphadenopathy LUNGS: clear to auscultation and normal breathing effort.  No wheeze or rales HEART: regular rate & rhythm and no murmurs ABDOMEN:abdomen soft, non-tender and normal bowel sounds   LABORATORY DATA:  I have reviewed the data as listed Lab Results  Component Value Date   WBC 6.1 05/31/2023   HGB 11.7 (L) 05/31/2023   HCT 37.2 (L) 05/31/2023   MCV 93.0 05/31/2023   PLT 125 (L) 05/31/2023   Recent Labs     03/21/23 1229 04/26/23 1208 05/10/23 0630 05/30/23 1950 05/31/23 0459 05/31/23 1255  NA 145 143   < > 145 140 145  K 3.8 3.3*   < > 2.7* 2.8* 3.1*  CL 108 105   < > 110 108 110  CO2 34* 35*   < > 26 26 26   GLUCOSE 84 166*   < > 145* 150* 146*  BUN 14 20   < > 15 14 14   CREATININE 1.07 1.15   < > 0.90 1.06 1.10  CALCIUM 9.5 9.3   < > 8.7* 8.1* 8.7*  GFRNONAA >60 >60   < > >60 >60 >60  PROT 6.7 6.8  --  6.9  --   --   ALBUMIN 3.8 3.8  --  3.2*  --   --   AST 16 15  --  36  --   --   ALT 16 14  --  23  --   --   ALKPHOS 67 65  --  57  --   --   BILITOT 0.4 0.5  --  0.7  --   --    < > = values in this interval not displayed.    RADIOGRAPHIC STUDIES: I have personally reviewed the radiological images as listed and agreed with the findings in the report. DG Chest Port 1 View Result Date: 05/30/2023 CLINICAL DATA:  Weakness for 1 day, prostate cancer EXAM: PORTABLE CHEST 1 VIEW COMPARISON:  02/04/2023 FINDINGS: Single frontal view of the chest demonstrates an unremarkable cardiac silhouette. No acute airspace disease, effusion, or pneumothorax. No acute bony abnormalities. IMPRESSION: 1. No acute intrathoracic process. Electronically Signed   By: Bobbye Burrow M.D.   On: 05/30/2023 20:45

## 2023-05-31 NOTE — Assessment & Plan Note (Signed)
 Agree with antibiotics per primary team

## 2023-05-31 NOTE — Progress Notes (Signed)
   05/31/23 1319  TOC Brief Assessment  Insurance and Status Reviewed  Patient has primary care physician Yes  Home environment has been reviewed Home w/ spouse  Prior level of function: modified Independent  Prior/Current Home Services No current home services  Social Drivers of Health Review SDOH reviewed no interventions necessary  Readmission risk has been reviewed Yes  Transition of care needs no transition of care needs at this time

## 2023-05-31 NOTE — H&P (Addendum)
 History and Physical  Chase Cook ZOX:[id number] DOB: 05-Oct-1945 DOA: 05/30/2023  PCP: Doak Free, MD   Chief Complaint: Generalized weakness  HPI: Chase Cook is a 78 y.o. male with medical history significant for prostate cancer s/p gold seed implant currently on Zytiga  and prednisone  and undergoing daily radiation therapy, HTN, HLD and urinary incontinence who presented to the ED for evaluation of generalized weakness.  Patient reports that yesterday, he became significantly weak and was unable to get up after falling on his floor.  EMS was called to assist in lifting patient up.  This morning, he was able to undergo his daily radiation therapy but while at home, he fell again in his bedroom and was unable to get up.  EMS was called again the patient presented for further evaluation. He reports feeling significantly weak and fatigue.  He has had frequent urination after starting radiation therapy but lately he is also having burning with urination. He reports feeling thirsty but denies any fevers, chills, nausea, abdominal pain, cough, shortness of breath, chest pain, dizziness, headaches or hematuria.  ED Course: Initial vitals significant for temp 101.5 and hypertensive with BP 166/79.  Labs show WBC 8.4, Hgb 12.7, platelet 142, sodium 145, K+ 2.7, normal kidney function, blood sugar 145, lactic acid 1.2, UA initially reported as normal but corrected results shows large hemoglobinuria, mild ketonuria, moderate proteinuria, positive nitrate, large leuks, RBC 21-50, WBC >50, and many bacteria.  EKG shows sinus rhythm with PACs, RBBB and LAFB. CXR with no acute cardiopulmonary disease.  Patient was given IV Rocephin , IV vancomycin, IV LR 1 L bolus x 2 and potassium supplementation. TRH was consulted for admission.  Review of Systems: Please see HPI for pertinent positives and negatives. A complete 10 system review of systems are otherwise negative.  Past Medical History:   Diagnosis Date   Cancer (HCC)    Prostate   Elevated PSA    High cholesterol    Hypertension    Past Surgical History:  Procedure Laterality Date   GOLD SEED IMPLANT N/A 05/10/2023   Procedure: INSERTION, GOLD SEEDS;  Surgeon: Chase Copp, MD;  Location: [facility] OR;  Service: Urology;  Laterality: N/A;   PROSTATE BIOPSY     SPACE OAR INSTILLATION N/A 05/10/2023   Procedure: INJECTION, HYDROGEL SPACER;  Surgeon: Chase Copp, MD;  Location: [facility] OR;  Service: Urology;  Laterality: N/A;   Social History:  reports that he has never smoked. He has never used smokeless tobacco. He reports that he does not drink alcohol and does not use drugs.  Allergies  Allergen Reactions   Lactose Intolerance (Gi) Diarrhea and Other (See Comments)    cramps    Family History  Problem Relation Age of Onset   Diabetes Brother      Prior to Admission medications   Medication Sig Start Date End Date Taking? Authorizing Provider  abiraterone  acetate (ZYTIGA ) 250 MG tablet TAKE 4 TABLETS (1,000MG ) BY  MOUTH ONCE DAILY ON AN EMPTY  STOMACH 1 HOUR BEFORE OR 2 HOURS AFTER A MEAL 04/19/23   Lowanda Ruddy, MD  atorvastatin (LIPITOR) 10 MG tablet Take 10 mg by mouth daily.    [provider]  betamethasone dipropionate (DIPROLENE) 0.05 % ointment Apply topically 2 (two) times daily.    [provider]  cholecalciferol (VITAMIN D3) 25 MCG (1000 UNIT) tablet Take 1,000 Units by mouth daily.    [provider]  clotrimazole (LOTRIMIN) 1 % cream Apply 1 Application  topically as needed.    [provider]  hydrochlorothiazide  (HYDRODIURIL ) 25 MG tablet Take 1 tablet (25 mg total) by mouth daily. 03/28/23   Lowanda Ruddy, MD  oxybutynin (DITROPAN) 5 MG tablet Take 5 mg by mouth 3 (three) times daily.    [provider]  predniSONE  (DELTASONE ) 5 MG tablet Take 1 tablet (5 mg total) by mouth daily with breakfast. 03/21/23   Lowanda Ruddy, MD  tamsulosin (FLOMAX) 0.4 MG  CAPS capsule Take 0.4 mg by mouth.    [provider]  valsartan  (DIOVAN ) 160 MG tablet TAKE 1 TABLET (160 MG TOTAL) BY MOUTH DAILY. TAKE ONE HALF TABLET FOR BLOOD PRESSURE. 04/15/23   Lowanda Ruddy, MD    Physical Exam: BP (!) 186/89 (BP Location: Right Arm)   Pulse 79   Temp (!) 101.2 F (38.4 C) (Oral)   Resp 18   Ht 5\' 6"  (1.676 m)   Wt 111 kg   SpO2 96%   BMI 39.50 kg/m  General: Pleasant, well-appearing elderly man laying in bed. No acute distress. HEENT: [place]/AT. Anicteric sclera. Dry mucous membrane. CV: RRR. No murmurs, rubs, or gallops. Pulmonary: Lungs CTAB. Normal effort. No wheezing or rales. Abdominal: Soft, mild distention.  Nontender. Normal bowel sounds. Extremities: Palpable radial and DP pulses. Normal ROM. Skin: Warm and dry. No obvious rash or lesions. Neuro: A&Ox3. Moves all extremities. Normal sensation to light touch. No focal deficit. Psych: Normal mood and affect          Labs on Admission:  Basic Metabolic Panel: Recent Labs  Lab 05/30/23 1950  NA 145  K 2.7*  CL 110  CO2 26  GLUCOSE 145*  BUN 15  CREATININE 0.90  CALCIUM 8.7*   Liver Function Tests: Recent Labs  Lab 05/30/23 1950  AST 36  ALT 23  ALKPHOS 57  BILITOT 0.7  PROT 6.9  ALBUMIN 3.2*   No results for input(s): "LIPASE", "AMYLASE" in the last 168 hours. No results for input(s): "AMMONIA" in the last 168 hours. CBC: Recent Labs  Lab 05/30/23 1950  WBC 8.4  NEUTROABS 6.8  HGB 12.7*  HCT 40.2  MCV 91.8  PLT 142*   Cardiac Enzymes: No results for input(s): "CKTOTAL", "CKMB", "CKMBINDEX", "TROPONINI" in the last 168 hours. BNP (last 3 results) No results for input(s): "BNP" in the last 8760 hours.  ProBNP (last 3 results) No results for input(s): "PROBNP" in the last 8760 hours.  CBG: No results for input(s): "GLUCAP" in the last 168 hours.  Radiological Exams on Admission: DG Chest Port 1 View Result Date: 05/30/2023 CLINICAL DATA:  Weakness for 1  day, prostate cancer EXAM: PORTABLE CHEST 1 VIEW COMPARISON:  02/04/2023 FINDINGS: Single frontal view of the chest demonstrates an unremarkable cardiac silhouette. No acute airspace disease, effusion, or pneumothorax. No acute bony abnormalities. IMPRESSION: 1. No acute intrathoracic process. Electronically Signed   By: Bobbye Burrow M.D.   On: 05/30/2023 20:45   Assessment/Plan Chase Cook is a 78 y.o. male with medical history significant for prostate cancer s/p gold seed implant currently on Zytiga  and prednisone  and undergoing daily radiation therapy, HTN, HLD, BPH and urinary incontinence who presented to the ED for evaluation of generalized weakness and found to have UTI.  # Generalized weakness # Falls - Cancer patient currently undergoing radiation therapy with 2 days of worsening weakness and falls - This is in the setting of acute urinary tract infection and hypokalemia -  PT/OT eval and  treat - Check vitamin D levels - Fall precautions  # Acute cystitis - History of urinary incontinence with recent burning with urination - Patient febrile on admission, UA showing evidence of infection - Continue IV Rocephin  - Follow-up urine culture and blood culture - Trend CBC, fever curve  # Prostate cancer - Status post gold seed implant and currently undergoing daily radiation therapy - Continue Zytiga  and prednisone  - Dr. Lorri Rota, radiation oncologist, added to care team  # Hypokalemia - K+ 2.7 on admission, repleting - Follow-up morning BMP, mag and Phos - Hold home HCTZ for now  # HTN - BP elevated with SBP in the 160s to 190s - Continue valsartan  (irbesartan as substitute) - IV hydralazine 10 mg as needed for SBP >180  # HLD - Continue atorvastatin  # Urinary incontinence # BPH - Continue tamsulosin and oxybutynin  DVT prophylaxis: Lovenox     Code Status: Full Code  Consults called: None  Family Communication: Discussed admission with daughter and spouse at  bedside  Severity of Illness: The appropriate patient status for this patient is OBSERVATION. Observation status is judged to be reasonable and necessary in order to provide the required intensity of service to ensure the patient's safety. The patient's presenting symptoms, physical exam findings, and initial radiographic and laboratory data in the context of their medical condition is felt to place them at decreased risk for further clinical deterioration. Furthermore, it is anticipated that the patient will be medically stable for discharge from the hospital within 2 midnights of admission.   Level of care: Med-Surg   This record has been created using Conservation officer, historic buildings. Errors have been sought and corrected, but may not always be located. Such creation errors do not reflect on the standard of care.   Vita Grip, MD 05/31/2023, 12:20 AM [facility] Pager: [phone] Isaiah 41:10   If 7PM-7AM, please contact night-coverage [email or web address] Password [email or web address]

## 2023-06-01 DIAGNOSIS — R531 Weakness: Secondary | ICD-10-CM | POA: Diagnosis not present

## 2023-06-01 LAB — CBC
HCT: 40.3 % (ref 39.0–52.0)
Hemoglobin: 12.6 g/dL — ABNORMAL LOW (ref 13.0–17.0)
MCH: 29 pg (ref 26.0–34.0)
MCHC: 31.3 g/dL (ref 30.0–36.0)
MCV: 92.6 fL (ref 80.0–100.0)
Platelets: 141 10*3/uL — ABNORMAL LOW (ref 150–400)
RBC: 4.35 MIL/uL (ref 4.22–5.81)
RDW: 13.9 % (ref 11.5–15.5)
WBC: 4.8 10*3/uL (ref 4.0–10.5)
nRBC: 0 % (ref 0.0–0.2)

## 2023-06-01 LAB — BASIC METABOLIC PANEL WITH GFR
Anion gap: 12 (ref 5–15)
BUN: 13 mg/dL (ref 8–23)
CO2: 28 mmol/L (ref 22–32)
Calcium: 8.7 mg/dL — ABNORMAL LOW (ref 8.9–10.3)
Chloride: 106 mmol/L (ref 98–111)
Creatinine, Ser: 1.05 mg/dL (ref 0.61–1.24)
GFR, Estimated: >60 mL/min (ref >60)
Glucose, Bld: 133 mg/dL — ABNORMAL HIGH (ref 70–99)
Potassium: 3.2 mmol/L — ABNORMAL LOW (ref 3.5–5.1)
Sodium: 146 mmol/L — ABNORMAL HIGH (ref 135–145)

## 2023-06-01 NOTE — Evaluation (Signed)
 Occupational Therapy Evaluation Patient Details Name: Chase Cook MRN: [id number] DOB: 1945-12-28 Today's Date: 06/01/2023   History of Present Illness   Pt is 78 yr old male who presented on 05/30/23 with generalized weakness and 2 recent falls. Pt with hypokalemia and acute cystitis.  Pt with hx including but not limited to prostate CA s/p gold seed implant currently on Zytiga  and prednisone  and undergoing daily radiation therapy, HTN, HLD, BPH and urinary incontinence     Clinical Impressions The pt completed all assessed tasks with distant supervision or better, including lower body dressing, sit to stand, ambulating without an assistive device, and hand washing in standing at the sink. He appears to be very near to his baseline level of functioning for self-care management, and he is not presenting with functional deficits that would warrant the need for skilled OT services. OT will sign off and recommend he return home at discharge.      If plan is discharge home, recommend the following:   Assist for transportation     Functional Status Assessment   Patient has not had a recent decline in their functional status     Equipment Recommendations   None recommended by OT     Recommendations for Other Services         Precautions/Restrictions   Restrictions Weight Bearing Restrictions Per Provider Order: No     Mobility Bed Mobility               General bed mobility comments: he was received seated in the bedside chair    Transfers Overall transfer level: Independent   Transfers: Sit to/from Stand       Balance     Sitting balance-Leahy Scale: Good       Standing balance-Leahy Scale: Good             ADL either performed or assessed with clinical judgement   ADL Overall ADL's : Modified independent;Independent;At baseline          General ADL Comments: He performed all assessed tasks with distant supervision or better, including  lower body dressing/doffing and donning socks seated in the bedside chair and hand washing in standing at the sink. He was also up in the bathroom independently, upon this OT initially entering his room.     Vision   Additional Comments: He correctly read the time depicted on the wall clock.            Pertinent Vitals/Pain Pain Assessment Pain Assessment: No/denies pain     Extremity/Trunk Assessment Upper Extremity Assessment Upper Extremity Assessment: Overall WFL for tasks assessed;Right hand dominant   Lower Extremity Assessment Lower Extremity Assessment: Overall WFL for tasks assessed       Communication Communication Communication: No apparent difficulties   Cognition Arousal: Alert Behavior During Therapy: WFL for tasks assessed/performed               OT - Cognition Comments: Oriented x4, able to follow commands without difficulty, friendly                 Following commands: Intact                  Home Living Family/patient expects to be discharged to:: Private residence Living Arrangements: Spouse/significant other Available Help at Discharge: Family Type of Home: House Home Access: Stairs to enter Secretary/administrator of Steps: 4 Entrance Stairs-Rails: None Home Layout: One level     Bathroom Shower/Tub: Psychologist, counselling  Home Equipment: Agricultural consultant (2 wheels);Cane - single point          Prior Functioning/Environment Prior Level of Function : Independent/Modified Independent;Driving             Mobility Comments: He reported having 2 recent falls. He is normally ambulatory without an assistive device. ADLs Comments: He was independent with ADLs and driving, and shared the household cleaning with his spouse.    OT Problem List: Other (comment) (N/A)   OT Treatment/Interventions:   No further treatment needs identified      OT Goals(Current goals can be found in the care plan section)   Acute Rehab OT  Goals OT Goal Formulation: All assessment and education complete, DC therapy   OT Frequency:   N/A       AM-PAC OT "6 Clicks" Daily Activity     Outcome Measure Help from another person eating meals?: None Help from another person taking care of personal grooming?: None Help from another person toileting, which includes using toliet, bedpan, or urinal?: None Help from another person bathing (including washing, rinsing, drying)?: None Help from another person to put on and taking off regular upper body clothing?: None Help from another person to put on and taking off regular lower body clothing?: None 6 Click Score: 24   End of Session Equipment Utilized During Treatment: Other (comment) (N/A) Nurse Communication: Mobility status  Activity Tolerance: Patient tolerated treatment well Patient left: in chair;with call bell/phone within reach;with chair alarm set  OT Visit Diagnosis: History of falling (Z91.81)                Time: 9562-1308 OT Time Calculation (min): 16 min Charges:  OT General Charges $OT Visit: 1 Visit OT Evaluation $OT Eval Low Complexity: 1 Low   Jeilyn Reznik L Stepfanie Yott, OTR/L 06/01/2023, 12:30 PM

## 2023-06-01 NOTE — Plan of Care (Addendum)
 SBP 150-160s, all other VSS. No c/o pain. LBM 5/16. No acute events overnight.  Problem: Education: Goal: Knowledge of General Education information will improve Description: Including pain rating scale, medication(s)/side effects and non-pharmacologic comfort measures Outcome: Progressing   Problem: Health Behavior/Discharge Planning: Goal: Ability to manage health-related needs will improve Outcome: Progressing   Problem: Clinical Measurements: Goal: Ability to maintain clinical measurements within normal limits will improve Outcome: Progressing Goal: Will remain free from infection Outcome: Progressing   Problem: Safety: Goal: Ability to remain free from injury will improve Outcome: Progressing   Problem: Urinary Elimination: Goal: Signs and symptoms of infection will decrease Outcome: Progressing

## 2023-06-01 NOTE — Plan of Care (Signed)

## 2023-06-01 NOTE — Progress Notes (Signed)
 PROGRESS NOTE  Chase Cook  ZOX:[id number] DOB: 1945/12/04 DOA: 05/30/2023 PCP: Doak Free, MD  Consultants  Brief Narrative: 78 y.o. male with medical history significant for prostate cancer s/p gold seed implant currently on Zytiga  and prednisone  and undergoing daily radiation therapy, HTN, HLD and urinary incontinence who presented to the ED for evaluation of generalized weakness.  Also with fall.  This morning, he was able to undergo his daily radiation therapy but while at home, he fell again in his bedroom and was unable to get up.  Brought to the emergency department for the same.  In ED found to have temp of 101.5 and UA positive for UTI.  Admitted for the same plus weakness  Assessment & Plan: Generalized weakness # Falls - Cancer patient currently undergoing radiation therapy with 2 days of worsening weakness and falls in the setting of acute urinary tract infection and hypokalemia -  PT/OT eval and treat - Check vitamin D  levels - Fall precautions - Improving today.  Feels he is starting to regain his strength.   # Acute cystitis - History of urinary incontinence with recent burning with urination - Patient febrile on admission, UA showing evidence of infection - Continue IV Rocephin  - Follow-up urine culture and blood culture --> still pending.   # Prostate cancer - Status post gold seed implant and currently undergoing daily radiation therapy - Appreciate Dr. Alita Irwin coming by to see patient earlier.  Holding abiraterone  and prednisone    # Hypokalemia - replacing - Follow-up morning BMP, mag and Phos - Hold home HCTZ for now  Hypophosphatemia: - Adding K-Phos to help with potassium as well.  Recheck   # HTN - Downtrending since admission. - Continue valsartan  (irbesartan  as substitute) - IV hydralazine  10 mg as needed for SBP >180   # HLD - Continue atorvastatin    # Urinary incontinence # BPH - Continue tamsulosin  and oxybutynin     DVT  prophylaxis:  enoxaparin  (LOVENOX ) injection 40 mg Start: 05/31/23 1000 Code Status:   Code Status: Full Code Level of care: Med-Surg Status is: Inpatient Remains inpatient appropriate because: UTI   Subjective: Patient feels much better today than he has.  He feels that his appetite is coming back.  Still with some dysuria but it is better since admission.  No other complaints or concerns.  Objective: Vitals:   05/31/23 1338 05/31/23 2028 06/01/23 0502 06/01/23 1233  BP: (!) 149/54 (!) 151/75 (!) 163/86 (!) 146/68  Pulse: 91 81 74 88  Resp: 17 18 18 20   Temp: 99.4 F (37.4 C) 99.9 F (37.7 C) 98.6 F (37 C) 98.4 F (36.9 C)  TempSrc: Oral Oral Oral Oral  SpO2: 97% 93% 94% 97%  Weight:      Height:        Intake/Output Summary (Last 24 hours) at 06/01/2023 1542 Last data filed at 06/01/2023 1500 Gross per 24 hour  Intake 220 ml  Output 120 ml  Net 100 ml   Filed Weights   05/30/23 1959  Weight: 111 kg   Body mass index is 39.5 kg/m.  Gen: 78 y.o. male in no apparent distress.  Nontoxic Pulm: Non-labored breathing.  Clear to auscultation bilaterally.  Distant breath sounds CV: Regular rate and rhythm.  GI: Abdomen soft, non-tender, non-distended, with normoactive bowel sounds. No organomegaly or masses felt. Ext: Warm, no deformities Skin: No rashes, lesions no ulcers Neuro: Alert and oriented. No focal neurological deficits. Psych: Calm  Judgement and insight appear normal. Mood &  affect appropriate.     I have personally reviewed the following labs and images: CBC: Recent Labs  Lab 05/30/23 1950 05/31/23 0459 06/01/23 0705  WBC 8.4 6.1 4.8  NEUTROABS 6.8  --   --   HGB 12.7* 11.7* 12.6*  HCT 40.2 37.2* 40.3  MCV 91.8 93.0 92.6  PLT 142* 125* 141*   BMP &GFR Recent Labs  Lab 05/30/23 1950 05/31/23 0459 05/31/23 1255 06/01/23 0705  NA 145 140 145 146*  K 2.7* 2.8* 3.1* 3.2*  CL 110 108 110 106  CO2 26 26 26 28   GLUCOSE 145* 150* 146* 133*   BUN 15 14 14 13   CREATININE 0.90 1.06 1.10 1.05  CALCIUM  8.7* 8.1* 8.7* 8.7*  MG  --  1.9 2.0  --   PHOS  --  2.1* 1.5*  --    Estimated Creatinine Clearance: 68.9 mL/min (by C-G formula based on SCr of 1.05 mg/dL). Liver & Pancreas: Recent Labs  Lab 05/30/23 1950  AST 36  ALT 23  ALKPHOS 57  BILITOT 0.7  PROT 6.9  ALBUMIN 3.2*   No results for input(s): "LIPASE", "AMYLASE" in the last 168 hours. No results for input(s): "AMMONIA" in the last 168 hours. Diabetic: No results for input(s): "HGBA1C" in the last 72 hours. No results for input(s): "GLUCAP" in the last 168 hours. Cardiac Enzymes: No results for input(s): "CKTOTAL", "CKMB", "CKMBINDEX", "TROPONINI" in the last 168 hours. No results for input(s): "PROBNP" in the last 8760 hours. Coagulation Profile: Recent Labs  Lab 05/30/23 2020  INR 1.2   Thyroid Function Tests: No results for input(s): "TSH", "T4TOTAL", "FREET4", "T3FREE", "THYROIDAB" in the last 72 hours. Lipid Profile: No results for input(s): "CHOL", "HDL", "LDLCALC", "TRIG", "CHOLHDL", "LDLDIRECT" in the last 72 hours. Anemia Panel: No results for input(s): "VITAMINB12", "FOLATE", "FERRITIN", "TIBC", "IRON", "RETICCTPCT" in the last 72 hours. Urine analysis:    Component Value Date/Time   COLORURINE YELLOW 05/30/2023 2037   APPEARANCEUR CLOUDY (A) 05/30/2023 2037   LABSPEC 1.017 05/30/2023 2037   PHURINE 5.0 05/30/2023 2037   GLUCOSEU NEGATIVE 05/30/2023 2037   HGBUR LARGE (A) 05/30/2023 2037   BILIRUBINUR NEGATIVE 05/30/2023 2037   KETONESUR 5 (A) 05/30/2023 2037   PROTEINUR 100 (A) 05/30/2023 2037   UROBILINOGEN 1.0 06/30/2014 2140   NITRITE POSITIVE (A) 05/30/2023 2037   LEUKOCYTESUR LARGE (A) 05/30/2023 2037   Sepsis Labs: Invalid input(s): "PROCALCITONIN", "LACTICIDVEN"  Microbiology: Recent Results (from the past 240 hours)  Resp panel by RT-PCR (RSV, Flu A&B, Covid) Anterior Nasal Swab     Status: None   Collection Time: 05/30/23   7:42 PM   Specimen: Anterior Nasal Swab  Result Value Ref Range Status   SARS Coronavirus 2 by RT PCR NEGATIVE NEGATIVE Final    Comment: (NOTE) SARS-CoV-2 target nucleic acids are NOT DETECTED.  The SARS-CoV-2 RNA is generally detectable in upper respiratory specimens during the acute phase of infection. The lowest concentration of SARS-CoV-2 viral copies this assay can detect is 138 copies/mL. A negative result does not preclude SARS-Cov-2 infection and should not be used as the sole basis for treatment or other patient management decisions. A negative result may occur with  improper specimen collection/handling, submission of specimen other than nasopharyngeal swab, presence of viral mutation(s) within the areas targeted by this assay, and inadequate number of viral copies(<138 copies/mL). A negative result must be combined with clinical observations, patient history, and epidemiological information. The expected result is Negative.  Fact Sheet for Patients:  BloggerCourse.com  Fact Sheet for Healthcare Providers:  SeriousBroker.it  This test is no t yet approved or cleared by the [place]  FDA and  has been authorized for detection and/or diagnosis of SARS-CoV-2 by FDA under an Emergency Use Authorization (EUA). This EUA will remain  in effect (meaning this test can be used) for the duration of the COVID-19 declaration under Section 564(b)(1) of the Act, 21 U.S.C.section 360bbb-3(b)(1), unless the authorization is terminated  or revoked sooner.       Influenza A by PCR NEGATIVE NEGATIVE Final   Influenza B by PCR NEGATIVE NEGATIVE Final    Comment: (NOTE) The Xpert Xpress SARS-CoV-2/FLU/RSV plus assay is intended as an aid in the diagnosis of influenza from Nasopharyngeal swab specimens and should not be used as a sole basis for treatment. Nasal washings and aspirates are unacceptable for Xpert Xpress  SARS-CoV-2/FLU/RSV testing.  Fact Sheet for Patients: BloggerCourse.com  Fact Sheet for Healthcare Providers: SeriousBroker.it  This test is not yet approved or cleared by the [place]  FDA and has been authorized for detection and/or diagnosis of SARS-CoV-2 by FDA under an Emergency Use Authorization (EUA). This EUA will remain in effect (meaning this test can be used) for the duration of the COVID-19 declaration under Section 564(b)(1) of the Act, 21 U.S.C. section 360bbb-3(b)(1), unless the authorization is terminated or revoked.     Resp Syncytial Virus by PCR NEGATIVE NEGATIVE Final    Comment: (NOTE) Fact Sheet for Patients: BloggerCourse.com  Fact Sheet for Healthcare Providers: SeriousBroker.it  This test is not yet approved or cleared by the [place]  FDA and has been authorized for detection and/or diagnosis of SARS-CoV-2 by FDA under an Emergency Use Authorization (EUA). This EUA will remain in effect (meaning this test can be used) for the duration of the COVID-19 declaration under Section 564(b)(1) of the Act, 21 U.S.C. section 360bbb-3(b)(1), unless the authorization is terminated or revoked.  Performed at [facility], [address], [place], [place] [address]   Blood Culture (routine x 2)     Status: None (Preliminary result)   Collection Time: 05/30/23  7:50 PM   Specimen: BLOOD RIGHT HAND  Result Value Ref Range Status   Specimen Description   Final    BLOOD RIGHT HAND Performed at [facility], [address], [place], [place] [address]    Special Requests   Final    BOTTLES DRAWN AEROBIC ONLY Blood Culture results may not be optimal due to an inadequate volume of blood received in culture bottles Performed at [facility], [address], [place], [place] [address]    Culture   Final    NO GROWTH 2  DAYS Performed at [facility], [address], [place], [place] [address]    Report Status PENDING  Incomplete  Blood Culture (routine x 2)     Status: None (Preliminary result)   Collection Time: 05/30/23  7:50 PM   Specimen: BLOOD  Result Value Ref Range Status   Specimen Description   Final    BLOOD LEFT ANTECUBITAL Performed at [facility], [address] [place], [place] [address]    Special Requests   Final    BOTTLES DRAWN AEROBIC AND ANAEROBIC Blood Culture adequate volume Performed at [facility], [address], [place], [place] [address]    Culture   Final    NO GROWTH 2 DAYS Performed at [facility], [address], [place], [place] [address]    Report Status PENDING  Incomplete  Urine Culture     Status: Abnormal   Collection Time: 05/30/23  8:37 PM   Specimen: Urine, Random  Result Value Ref Range Status   Specimen Description   Final    URINE, RANDOM Performed at [facility], [address] [place], [place] [address]    Special Requests   Final    NONE Reflexed from [id number] Performed at [facility], [address] [place], [place] [address]    Culture MULTIPLE SPECIES PRESENT, SUGGEST RECOLLECTION (A)  Final   Report Status 05/31/2023 FINAL  Final    Radiology Studies: No results found.   Scheduled Meds:  atorvastatin   10 mg Oral Daily   cholecalciferol   1,000 Units Oral Daily   enoxaparin  (LOVENOX ) injection  40 mg Subcutaneous Q24H   irbesartan   150 mg Oral Daily   tamsulosin   0.4 mg Oral Daily   Continuous Infusions:  cefTRIAXone  (ROCEPHIN )  IV 1 g (05/31/23 2023)     LOS: 1 day   35 minutes with more than 50% spent in reviewing records, counseling patient/family and coordinating care.  Trenton Frock, MD [facility] [email or web address] 06/01/2023, 3:42 PM

## 2023-06-02 DIAGNOSIS — R531 Weakness: Secondary | ICD-10-CM | POA: Diagnosis not present

## 2023-06-02 LAB — CBC
HCT: 38.8 % — ABNORMAL LOW (ref 39.0–52.0)
Hemoglobin: 12 g/dL — ABNORMAL LOW (ref 13.0–17.0)
MCH: 28.9 pg (ref 26.0–34.0)
MCHC: 30.9 g/dL (ref 30.0–36.0)
MCV: 93.5 fL (ref 80.0–100.0)
Platelets: 163 10*3/uL (ref 150–400)
RBC: 4.15 MIL/uL — ABNORMAL LOW (ref 4.22–5.81)
RDW: 14 % (ref 11.5–15.5)
WBC: 4.2 10*3/uL (ref 4.0–10.5)
nRBC: 0 % (ref 0.0–0.2)

## 2023-06-02 LAB — BASIC METABOLIC PANEL WITH GFR
Anion gap: 7 (ref 5–15)
BUN: 15 mg/dL (ref 8–23)
CO2: 29 mmol/L (ref 22–32)
Calcium: 8.5 mg/dL — ABNORMAL LOW (ref 8.9–10.3)
Chloride: 108 mmol/L (ref 98–111)
Creatinine, Ser: 1.01 mg/dL (ref 0.61–1.24)
GFR, Estimated: >60 mL/min (ref >60)
Glucose, Bld: 129 mg/dL — ABNORMAL HIGH (ref 70–99)
Potassium: 3.1 mmol/L — ABNORMAL LOW (ref 3.5–5.1)
Sodium: 144 mmol/L (ref 135–145)

## 2023-06-02 LAB — PHOSPHORUS: Phosphorus: 2.5 mg/dL (ref 2.5–4.6)

## 2023-06-02 LAB — MAGNESIUM: Magnesium: 2.1 mg/dL (ref 1.7–2.4)

## 2023-06-02 MED ORDER — K PHOS MONO-SOD PHOS DI & MONO 155-852-130 MG PO TABS
500.0000 mg | ORAL_TABLET | Freq: Once | ORAL | Status: AC
  Administered 2023-06-02: 500 mg via ORAL
  Filled 2023-06-02: qty 2

## 2023-06-02 MED ORDER — CEFADROXIL 500 MG PO CAPS: 1000.0000 mg | ORAL_CAPSULE | Freq: Two times a day (BID) | ORAL | 0 refills | Status: AC

## 2023-06-02 MED ORDER — POTASSIUM CHLORIDE CRYS ER 20 MEQ PO TBCR: 20.0000 meq | EXTENDED_RELEASE_TABLET | Freq: Every day | ORAL | 0 refills | Status: DC

## 2023-06-02 NOTE — Plan of Care (Signed)

## 2023-06-02 NOTE — Discharge Instructions (Signed)
 Also please do not take you hydrochlorthiazide because this can cause low potassium, until you see your follow-up doctor appointment.

## 2023-06-02 NOTE — Discharge Summary (Signed)
 Physician Discharge Summary   Patient: Chase Cook MRN: [id number] DOB: [birth year]  Admit date:     05/30/2023  Discharge date: 06/02/23  Discharge Physician: Trenton Frock   PCP: Doak Free, MD   Recommendations at discharge:   Patient discharged home with instructions to hold his abiraterone  until he sees medical oncology later this week. Per Dr. Alita Irwin, they will contact him to schedule an appointment this week for him. He was discharged home with 7 days of Duricef to take twice daily in addition to the antibiotics he received here in the hospital.  2 tabs twice daily for 7 days total. He was also discharged in the 70s with a potassium.  Recommend follow-up to assess his potassium status since he was quite low on admission. It was recommended to him to hold his hydrochlorothiazide  due to persistent hypokalemia and have his blood pressure checked at his next follow-up outpatient appointment.  Discharge Diagnoses: Principal Problem:   Generalized weakness Active Problems:   Prostate cancer (HCC)   Acute cystitis with hematuria   Hypokalemia   Current chronic use of systemic steroids   UTI (urinary tract infection)  Resolved Problems:   * No resolved hospital problems. *  Hospital Course: 78 y.o. male with medical history significant for prostate cancer s/p gold seed implant currently on Zytiga  and prednisone  and undergoing daily radiation therapy, HTN, HLD and urinary incontinence who presented to the ED for evaluation of generalized weakness.  Also with fall.  This morning, he was able to undergo his daily radiation therapy but while at home, he fell again in his bedroom and was unable to get up.  Brought to the emergency department for the same.  In ED found to have temp of 101.5 and UA positive for UTI.  Admitted for the same plus weakness.    Temperature quickly resolved while in house his urine culture grew multiple species without predominant.  He was treated  with ceftriaxone  through the hospital course.  His strength  also rapidly improved with hydration and antibiotics.  Occupational Therapy signed off day before discharge.  On day of discharge she was up and easily moving around the room without walker.  He was therefore discharged home on 5/18 with 7 more days of Duricef to treat his urinary tract infection.  Also notably hypokalemic while in house.  Daily replacements as well as replacement of phosphorus and magnesium.  On day of discharge he was hypokalemic to 3.1.  This was replaced prior to him leaving the hospital and he was also discharged home with daily 20 mEq dose of K-Dur.  Recommend follow-up BMP at outpatient appointment.   Assessment & Plan: Generalized weakness # Falls - Cancer patient currently undergoing radiation therapy with 2 days of worsening weakness and falls in the setting of acute urinary tract infection and hypokalemia - Vastly improved.  Due to degree of improvement he could be discharged 5/18.  # Acute cystitis - History of urinary incontinence with recent burning with urination - No predominant culture speciation. - Discharged home with 7 more days of Duricef.     # Prostate cancer - Status post gold seed implant and currently undergoing daily radiation therapy - Appreciate Dr. Alita Irwin coming by to see patient earlier.  Holding abiraterone  until his follow-up appointment later this week.   # Hypokalemia - replacing prior to discharge.-Also discharging home with daily 20 electrolyte dosing for the next 7 days.   - Recommend holding his home hydrochlorothiazide  due to  ongoing hypokalemia.Aaron Aas  He needs recheck of blood pressure later this week.     Hypophosphatemia: - Adding K-Phos to help with potassium as well.  Better at discharge   # HTN - Downtrending since admission. - Continue valsartan  (irbesartan  as substitute) - IV hydralazine  10 mg as needed for SBP >180   # HLD - Continue atorvastatin    # Urinary  incontinence # BPH - Continue tamsulosin  and oxybutynin         Consultants: None Procedures performed: None Disposition: Home Diet recommendation:  Discharge Diet Orders (From admission, onward)     Start     Ordered   06/02/23 0000  Diet - low sodium heart healthy        06/02/23 1015           Regular diet DISCHARGE MEDICATION: Allergies as of 06/02/2023       Reactions   Lactose Intolerance (gi) Diarrhea, Other (See Comments)   cramps        Medication List     STOP taking these medications    abiraterone  acetate 250 MG tablet Commonly known as: ZYTIGA        TAKE these medications    amLODipine 10 MG tablet Commonly known as: NORVASC Take 10 mg by mouth daily.   aspirin EC 81 MG tablet Take 81 mg by mouth daily. Swallow whole.   atorvastatin  10 MG tablet Commonly known as: LIPITOR Take 10 mg by mouth at bedtime.   betamethasone dipropionate 0.05 % ointment Commonly known as: DIPROLENE Apply 1 Application topically in the morning, at noon, and at bedtime.   CALCIUM  600 + D PO Take 1 tablet by mouth in the morning and at bedtime.   cefadroxil 500 MG capsule Commonly known as: DURICEF Take 2 capsules (1,000 mg total) by mouth 2 (two) times daily for 7 days.   clotrimazole 1 % cream Commonly known as: LOTRIMIN Apply 1 Application topically 2 (two) times daily as needed.   hydrochlorothiazide  25 MG tablet Commonly known as: HYDRODIURIL  Take 1 tablet (25 mg total) by mouth daily.   loratadine 10 MG tablet Commonly known as: CLARITIN Take 10 mg by mouth daily as needed for allergies.   oxybutynin  5 MG tablet Commonly known as: DITROPAN  Take 5 mg by mouth 3 (three) times daily.   potassium chloride  SA 20 MEQ tablet Commonly known as: KLOR-CON  M Take 1 tablet (20 mEq total) by mouth daily for 7 days.   predniSONE  5 MG tablet Commonly known as: DELTASONE  Take 1 tablet (5 mg total) by mouth daily with breakfast.   sildenafil 100 MG  tablet Commonly known as: VIAGRA Take 100 mg by mouth daily as needed for erectile dysfunction.   tamsulosin  0.4 MG Caps capsule Commonly known as: FLOMAX  Take 0.4 mg by mouth daily.   valsartan  160 MG tablet Commonly known as: DIOVAN  TAKE 1 TABLET (160 MG TOTAL) BY MOUTH DAILY. TAKE ONE HALF TABLET FOR BLOOD PRESSURE. What changed:  how much to take additional instructions   Vitamin D  50 MCG (2000 UT) Caps Take 2,000 Units by mouth daily.        Discharge Exam: Filed Weights   05/30/23 1959  Weight: 111 kg   Gen: 78 y.o. male in no apparent distress.  Nontoxic Pulm: Non-labored breathing.  Clear to auscultation bilaterally.  Distant breath sounds CV: Regular rate and rhythm.  GI: Abdomen soft, non-tender, non-distended, with normoactive bowel sounds. No organomegaly or masses felt. Ext: Warm, no deformities Skin: No rashes,  lesions no ulcers Neuro: Alert and oriented. No focal neurological deficits. Psych: Calm  Judgement and insight appear normal. Mood & affect appropriate.    Condition at discharge: good  The results of significant diagnostics from this hospitalization (including imaging, microbiology, ancillary and laboratory) are listed below for reference.   Imaging Studies: DG Chest Port 1 View Result Date: 05/30/2023 CLINICAL DATA:  Weakness for 1 day, prostate cancer EXAM: PORTABLE CHEST 1 VIEW COMPARISON:  02/04/2023 FINDINGS: Single frontal view of the chest demonstrates an unremarkable cardiac silhouette. No acute airspace disease, effusion, or pneumothorax. No acute bony abnormalities. IMPRESSION: 1. No acute intrathoracic process. Electronically Signed   By: Bobbye Burrow M.D.   On: 05/30/2023 20:45    Microbiology: Results for orders placed or performed during the hospital encounter of 05/30/23  Resp panel by RT-PCR (RSV, Flu A&B, Covid) Anterior Nasal Swab     Status: None   Collection Time: 05/30/23  7:42 PM   Specimen: Anterior Nasal Swab  Result  Value Ref Range Status   SARS Coronavirus 2 by RT PCR NEGATIVE NEGATIVE Final    Comment: (NOTE) SARS-CoV-2 target nucleic acids are NOT DETECTED.  The SARS-CoV-2 RNA is generally detectable in upper respiratory specimens during the acute phase of infection. The lowest concentration of SARS-CoV-2 viral copies this assay can detect is 138 copies/mL. A negative result does not preclude SARS-Cov-2 infection and should not be used as the sole basis for treatment or other patient management decisions. A negative result may occur with  improper specimen collection/handling, submission of specimen other than nasopharyngeal swab, presence of viral mutation(s) within the areas targeted by this assay, and inadequate number of viral copies(<138 copies/mL). A negative result must be combined with clinical observations, patient history, and epidemiological information. The expected result is Negative.  Fact Sheet for Patients:  BloggerCourse.com  Fact Sheet for Healthcare Providers:  SeriousBroker.it  This test is no t yet approved or cleared by the [place]  FDA and  has been authorized for detection and/or diagnosis of SARS-CoV-2 by FDA under an Emergency Use Authorization (EUA). This EUA will remain  in effect (meaning this test can be used) for the duration of the COVID-19 declaration under Section 564(b)(1) of the Act, 21 U.S.C.section 360bbb-3(b)(1), unless the authorization is terminated  or revoked sooner.       Influenza A by PCR NEGATIVE NEGATIVE Final   Influenza B by PCR NEGATIVE NEGATIVE Final    Comment: (NOTE) The Xpert Xpress SARS-CoV-2/FLU/RSV plus assay is intended as an aid in the diagnosis of influenza from Nasopharyngeal swab specimens and should not be used as a sole basis for treatment. Nasal washings and aspirates are unacceptable for Xpert Xpress SARS-CoV-2/FLU/RSV testing.  Fact Sheet for  Patients: BloggerCourse.com  Fact Sheet for Healthcare Providers: SeriousBroker.it  This test is not yet approved or cleared by the [place]  FDA and has been authorized for detection and/or diagnosis of SARS-CoV-2 by FDA under an Emergency Use Authorization (EUA). This EUA will remain in effect (meaning this test can be used) for the duration of the COVID-19 declaration under Section 564(b)(1) of the Act, 21 U.S.C. section 360bbb-3(b)(1), unless the authorization is terminated or revoked.     Resp Syncytial Virus by PCR NEGATIVE NEGATIVE Final    Comment: (NOTE) Fact Sheet for Patients: BloggerCourse.com  Fact Sheet for Healthcare Providers: SeriousBroker.it  This test is not yet approved or cleared by the [place]  FDA and has been authorized for detection and/or diagnosis of SARS-CoV-2  by FDA under an Emergency Use Authorization (EUA). This EUA will remain in effect (meaning this test can be used) for the duration of the COVID-19 declaration under Section 564(b)(1) of the Act, 21 U.S.C. section 360bbb-3(b)(1), unless the authorization is terminated or revoked.  Performed at [facility], [address] [place], [place] [address]   Blood Culture (routine x 2)     Status: None (Preliminary result)   Collection Time: 05/30/23  7:50 PM   Specimen: BLOOD RIGHT HAND  Result Value Ref Range Status   Specimen Description   Final    BLOOD RIGHT HAND Performed at [facility], [address], [place], [place] [address]    Special Requests   Final    BOTTLES DRAWN AEROBIC ONLY Blood Culture results may not be optimal due to an inadequate volume of blood received in culture bottles Performed at [facility], [address], [place], [place] [address]    Culture   Final    NO GROWTH 3 DAYS Performed at [facility],  [address], [place], [place] [address]    Report Status PENDING  Incomplete  Blood Culture (routine x 2)     Status: None (Preliminary result)   Collection Time: 05/30/23  7:50 PM   Specimen: BLOOD  Result Value Ref Range Status   Specimen Description   Final    BLOOD LEFT ANTECUBITAL Performed at [facility], [address] [place], [place] [address]    Special Requests   Final    BOTTLES DRAWN AEROBIC AND ANAEROBIC Blood Culture adequate volume Performed at [facility], [address], [place], [place] [address]    Culture   Final    NO GROWTH 3 DAYS Performed at [facility], [address], [place], [place] [address]    Report Status PENDING  Incomplete  Urine Culture     Status: Abnormal   Collection Time: 05/30/23  8:37 PM   Specimen: Urine, Random  Result Value Ref Range Status   Specimen Description   Final    URINE, RANDOM Performed at [facility], [address], [place], [place] [address]    Special Requests   Final    NONE Reflexed from [id number] Performed at [facility], [address] [place], [place] [address]    Culture MULTIPLE SPECIES PRESENT, SUGGEST RECOLLECTION (A)  Final   Report Status 05/31/2023 FINAL  Final    Labs: CBC: Recent Labs  Lab 05/30/23 1950 05/31/23 0459 06/01/23 0705 06/02/23 0723  WBC 8.4 6.1 4.8 4.2  NEUTROABS 6.8  --   --   --   HGB 12.7* 11.7* 12.6* 12.0*  HCT 40.2 37.2* 40.3 38.8*  MCV 91.8 93.0 92.6 93.5  PLT 142* 125* 141* 163   Basic Metabolic Panel: Recent Labs  Lab 05/30/23 1950 05/31/23 0459 05/31/23 1255 06/01/23 0705 06/02/23 0723  NA 145 140 145 146* 144  K 2.7* 2.8* 3.1* 3.2* 3.1*  CL 110 108 110 106 108  CO2 26 26 26 28 29   GLUCOSE 145* 150* 146* 133* 129*  BUN 15 14 14 13 15   CREATININE 0.90 1.06 1.10 1.05 1.01  CALCIUM  8.7* 8.1* 8.7* 8.7* 8.5*  MG  --  1.9 2.0  --  2.1  PHOS  --  2.1* 1.5*  --  2.5   Liver  Function Tests: Recent Labs  Lab 05/30/23 1950  AST 36  ALT 23  ALKPHOS 57  BILITOT 0.7  PROT 6.9  ALBUMIN 3.2*   CBG: No results for input(s): "GLUCAP" in the last 168 hours.  Discharge time spent: less than 30 minutes.  Signed: Trenton Frock, MD [facility] 06/02/2023

## 2023-06-03 ENCOUNTER — Other Ambulatory Visit: Payer: Self-pay

## 2023-06-03 ENCOUNTER — Ambulatory Visit
Admission: RE | Admit: 2023-06-03 | Discharge: 2023-06-03 | Disposition: A | Discharge status: Home / Self Care | Source: Ambulatory Visit | Attending: Radiation Oncology | Admitting: Radiation Oncology

## 2023-06-03 DIAGNOSIS — E876 Hypokalemia: Secondary | ICD-10-CM

## 2023-06-03 DIAGNOSIS — Z510 Encounter for antineoplastic radiation therapy: Secondary | ICD-10-CM | POA: Diagnosis present

## 2023-06-03 DIAGNOSIS — C61 Malignant neoplasm of prostate: Secondary | ICD-10-CM | POA: Diagnosis present

## 2023-06-03 LAB — RAD ONC ARIA SESSION SUMMARY
Course Elapsed Days: 12
Plan Fractions Treated to Date: 8
Plan Prescribed Dose Per Fraction: 1.8 Gy
Plan Total Fractions Prescribed: 25
Plan Total Prescribed Dose: 45 Gy
Reference Point Dosage Given to Date: 14.4 Gy
Reference Point Session Dosage Given: 1.8 Gy
Session Number: 8

## 2023-06-03 NOTE — Progress Notes (Signed)
 Please schedule follow up on Fri at 11AM with lab before visit. Thanks.

## 2023-06-04 ENCOUNTER — Other Ambulatory Visit: Payer: Self-pay

## 2023-06-04 ENCOUNTER — Inpatient Hospital Stay

## 2023-06-04 ENCOUNTER — Ambulatory Visit
Admission: RE | Admit: 2023-06-04 | Discharge: 2023-06-04 | Disposition: A | Discharge status: Home / Self Care | Payer: Self-pay | Source: Ambulatory Visit | Attending: Radiation Oncology | Admitting: Radiation Oncology

## 2023-06-04 ENCOUNTER — Telehealth: Payer: Self-pay

## 2023-06-04 ENCOUNTER — Ambulatory Visit
Admission: RE | Admit: 2023-06-04 | Discharge: 2023-06-04 | Disposition: A | Discharge status: Home / Self Care | Source: Ambulatory Visit | Attending: Radiation Oncology | Admitting: Radiation Oncology

## 2023-06-04 VITALS — BP 147/84 | HR 64 | Temp 97.3°F | Resp 17 | Wt 246.3 lb

## 2023-06-04 DIAGNOSIS — I159 Secondary hypertension, unspecified: Secondary | ICD-10-CM

## 2023-06-04 DIAGNOSIS — Z79899 Other long term (current) drug therapy: Secondary | ICD-10-CM | POA: Diagnosis not present

## 2023-06-04 DIAGNOSIS — C61 Malignant neoplasm of prostate: Secondary | ICD-10-CM

## 2023-06-04 DIAGNOSIS — Z510 Encounter for antineoplastic radiation therapy: Secondary | ICD-10-CM | POA: Diagnosis not present

## 2023-06-04 DIAGNOSIS — E876 Hypokalemia: Secondary | ICD-10-CM | POA: Diagnosis not present

## 2023-06-04 DIAGNOSIS — C775 Secondary and unspecified malignant neoplasm of intrapelvic lymph nodes: Secondary | ICD-10-CM | POA: Insufficient documentation

## 2023-06-04 LAB — RAD ONC ARIA SESSION SUMMARY
Course Elapsed Days: 13
Plan Fractions Treated to Date: 9
Plan Prescribed Dose Per Fraction: 1.8 Gy
Plan Total Fractions Prescribed: 25
Plan Total Prescribed Dose: 45 Gy
Reference Point Dosage Given to Date: 16.2 Gy
Reference Point Session Dosage Given: 1.8 Gy
Session Number: 9

## 2023-06-04 LAB — CULTURE, BLOOD (ROUTINE X 2)
Culture: NO GROWTH
Culture: NO GROWTH
Special Requests: ADEQUATE

## 2023-06-04 MED ORDER — AMLODIPINE BESYLATE 10 MG PO TABS: 10.0000 mg | ORAL_TABLET | Freq: Every day | ORAL | 0 refills | Status: DC

## 2023-06-04 NOTE — Assessment & Plan Note (Signed)
 Refill amlodipine 10 mg Continue valsartan  160 mg daily

## 2023-06-04 NOTE — Assessment & Plan Note (Signed)
 Continue valsartan 160 mg daily Continue hydrochlorothiazide 25 mg daily Return in 2 weeks with BP diary

## 2023-06-04 NOTE — Assessment & Plan Note (Addendum)
 Check BMP and Mg today. Continue oral potassium Follow up next Friday

## 2023-06-04 NOTE — Assessment & Plan Note (Addendum)
 Weakness, falls, hypokalemia required hospitalization.  Will stop Zytiga   ADT for 2 years if able to tolerate Follow-up with radiation oncology team as recommended

## 2023-06-04 NOTE — Telephone Encounter (Signed)
 TC to pt due to lab calling and looking for him for his appt following OV w/ Dr. Alita Irwin. Pt states he is already at home, unaware that he was supposed to have labs. After consulting w/ Dr. Alita Irwin, pt is added for a lab appointment tomorrow morning prior to radiation appt. Pt aware of 0830 lab appt tomorrow. Lab orders reentered d/t being released today in error.

## 2023-06-04 NOTE — Progress Notes (Signed)
 [facility] OFFICE PROGRESS NOTE  Patient Care Team: Doak Free, MD as PCP - General (Internal Medicine) Katheleen Palmer, RN as Oncology Nurse Navigator  Chase Cook is a 78 y.o.male with history of hypertension, hyperlipidemia being seen at Medical Oncology Clinic for prostate cancer.   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   Germline testing: not yet Somatic testing: not yet Treatment:  8 weeks of external beam therapy concurrent with LT-ADT  04/13/23 started AAP   Discussed risk and benefit of AAP. Given his age, complication resulted in hospitalization we elected to discontinue. Will monitor after RT and continue ADT as tolerated.  [facility] is getting [facility]T from [facility]. Last dose report a six month dosage.  Refilled amlodipine today. Monitor BP. Assessment & Plan Prostate cancer (HCC) Weakness, falls, hypokalemia required hospitalization.  Will stop Zytiga   ADT for 2 years if able to tolerate Follow-up with radiation oncology team as recommended Secondary hypertension Refill amlodipine 10 mg Continue valsartan  160 mg daily Hypokalemia Check BMP and Mg today. Continue oral potassium Follow up next Friday   Orders Placed This Encounter  Procedures   Basic Metabolic Panel - Cancer Center Only    Standing Status:   Future    Expiration Date:   06/03/2024     Chase Ruddy, MD  INTERVAL HISTORY: Patient returns for follow-up. Overall feeling better. Able to eat and drink. No diarrhea, stomach pain, chest pain, palpitation.  No short of breath.   Oncology History  Prostate cancer [facility])  01/04/2023 Pathology Results   MRI fusion biopsy  14/15 core biopsies positive.  The maximum Gleason score was 4+5 and multiple 4+4, and 4+3 prostate adenocarcinoma   02/04/2023 PET scan   PSMA PET 1. Intense radiotracer activity within the prostate gland  consistent primary prostate adenocarcinoma. 2. Metastatic lymph node posterior RIGHT of the rectum as described above. Lymph nodes: Radiotracer avid lymph node in the mesorectal sheath posterior RIGHT of the rectum measuring 8 mm on image 168 with SUV max equal 11.7. 3. No evidence of metastatic adenopathy outside the pelvis. 4. No visceral metastasis or skeletal metastasis.   02/25/2023 -  Chemotherapy   started on ADT with Firmagon on 02/25/23.    02/26/2023 Initial Diagnosis   Malignant neoplasm of prostate (HCC)   02/26/2023 Cancer Staging   Staging form: Prostate, AJCC 8th Edition - Clinical stage from 02/26/2023: Stage IVA (cT1c, cN1, cM0, PSA: 18.7, Grade Group: 5) - Signed by Kenith Payer, MD on 02/26/2023 Histopathologic type: Adenocarcinoma, NOS Stage prefix: Initial diagnosis Prostate specific antigen (PSA) range: 10 to 19 Gleason primary pattern: 4 Gleason secondary pattern: 5 Gleason score: 9 Histologic grading system: 5 grade system      PHYSICAL EXAMINATION: ECOG PERFORMANCE STATUS: 2 - Symptomatic, <50% confined to bed  Vitals:   06/04/23 1038  BP: (!) 147/84  Pulse: 64  Resp: 17  Temp: (!) 97.3 F (36.3 C)  SpO2: 95%   Filed Weights   06/04/23 1038  Weight: 246 lb 4.8 oz (111.7 kg)   No acute distress. No swelling.  Relevant data reviewed during this visit included labs and records.

## 2023-06-05 ENCOUNTER — Other Ambulatory Visit: Payer: Self-pay

## 2023-06-05 ENCOUNTER — Inpatient Hospital Stay

## 2023-06-05 ENCOUNTER — Ambulatory Visit
Admission: RE | Admit: 2023-06-05 | Discharge: 2023-06-05 | Disposition: A | Discharge status: Home / Self Care | Source: Ambulatory Visit | Attending: Radiation Oncology | Admitting: Radiation Oncology

## 2023-06-05 DIAGNOSIS — C61 Malignant neoplasm of prostate: Secondary | ICD-10-CM | POA: Diagnosis not present

## 2023-06-05 DIAGNOSIS — Z510 Encounter for antineoplastic radiation therapy: Secondary | ICD-10-CM | POA: Diagnosis not present

## 2023-06-05 LAB — RAD ONC ARIA SESSION SUMMARY
Course Elapsed Days: 14
Plan Fractions Treated to Date: 10
Plan Prescribed Dose Per Fraction: 1.8 Gy
Plan Total Fractions Prescribed: 25
Plan Total Prescribed Dose: 45 Gy
Reference Point Dosage Given to Date: 18 Gy
Reference Point Session Dosage Given: 1.8 Gy
Session Number: 10

## 2023-06-05 LAB — CMP (CANCER CENTER ONLY)
ALT: 49 U/L — ABNORMAL HIGH (ref 0–44)
AST: 43 U/L — ABNORMAL HIGH (ref 15–41)
Albumin: 3.4 g/dL — ABNORMAL LOW (ref 3.5–5.0)
Alkaline Phosphatase: 60 U/L (ref 38–126)
Anion gap: 7 (ref 5–15)
BUN: 14 mg/dL (ref 8–23)
CO2: 34 mmol/L — ABNORMAL HIGH (ref 22–32)
Calcium: 9.5 mg/dL (ref 8.9–10.3)
Chloride: 106 mmol/L (ref 98–111)
Creatinine: 1.01 mg/dL (ref 0.61–1.24)
GFR, Estimated: >60 mL/min (ref >60)
Glucose, Bld: 120 mg/dL — ABNORMAL HIGH (ref 70–99)
Potassium: 3.2 mmol/L — ABNORMAL LOW (ref 3.5–5.1)
Sodium: 147 mmol/L — ABNORMAL HIGH (ref 135–145)
Total Bilirubin: 0.4 mg/dL (ref 0.0–1.2)
Total Protein: 6.5 g/dL (ref 6.5–8.1)

## 2023-06-05 LAB — MAGNESIUM: Magnesium: 1.7 mg/dL (ref 1.7–2.4)

## 2023-06-06 ENCOUNTER — Ambulatory Visit
Admission: RE | Admit: 2023-06-06 | Discharge: 2023-06-06 | Disposition: A | Discharge status: Home / Self Care | Source: Ambulatory Visit | Attending: Radiation Oncology | Admitting: Radiation Oncology

## 2023-06-06 ENCOUNTER — Ambulatory Visit: Payer: Self-pay

## 2023-06-06 ENCOUNTER — Other Ambulatory Visit: Payer: Self-pay

## 2023-06-06 DIAGNOSIS — E876 Hypokalemia: Secondary | ICD-10-CM

## 2023-06-06 DIAGNOSIS — R748 Abnormal levels of other serum enzymes: Secondary | ICD-10-CM

## 2023-06-06 DIAGNOSIS — Z510 Encounter for antineoplastic radiation therapy: Secondary | ICD-10-CM | POA: Diagnosis not present

## 2023-06-06 DIAGNOSIS — Z9189 Other specified personal risk factors, not elsewhere classified: Secondary | ICD-10-CM

## 2023-06-06 LAB — RAD ONC ARIA SESSION SUMMARY
Course Elapsed Days: 15
Plan Fractions Treated to Date: 11
Plan Prescribed Dose Per Fraction: 1.8 Gy
Plan Total Fractions Prescribed: 25
Plan Total Prescribed Dose: 45 Gy
Reference Point Dosage Given to Date: 19.8 Gy
Reference Point Session Dosage Given: 1.8 Gy
Session Number: 11

## 2023-06-06 MED ORDER — POTASSIUM CHLORIDE CRYS ER 10 MEQ PO TBCR: 20.0000 meq | EXTENDED_RELEASE_TABLET | Freq: Two times a day (BID) | ORAL | 1 refills | Status: DC

## 2023-06-06 NOTE — Telephone Encounter (Signed)
 TC to inform of the below message by Dr. Cherly Hensen. Pt verbalizes understanding.

## 2023-06-06 NOTE — Telephone Encounter (Signed)
-----   Message from Lowanda Ruddy sent at 06/06/2023  1:54 PM EDT ----- Ethyl Hering please let him know can increase potassium to 20 meq twice daily. I sent a new script of 10 mEq so he should take 2 tab twice daily. Thanks

## 2023-06-07 ENCOUNTER — Ambulatory Visit
Admission: RE | Admit: 2023-06-07 | Discharge: 2023-06-07 | Disposition: A | Discharge status: Home / Self Care | Source: Ambulatory Visit | Attending: Radiation Oncology | Admitting: Radiation Oncology

## 2023-06-07 ENCOUNTER — Other Ambulatory Visit: Payer: Self-pay

## 2023-06-07 DIAGNOSIS — Z510 Encounter for antineoplastic radiation therapy: Secondary | ICD-10-CM | POA: Diagnosis not present

## 2023-06-07 LAB — RAD ONC ARIA SESSION SUMMARY
Course Elapsed Days: 16
Plan Fractions Treated to Date: 12
Plan Prescribed Dose Per Fraction: 1.8 Gy
Plan Total Fractions Prescribed: 25
Plan Total Prescribed Dose: 45 Gy
Reference Point Dosage Given to Date: 21.6 Gy
Reference Point Session Dosage Given: 1.8 Gy
Session Number: 12

## 2023-06-11 ENCOUNTER — Ambulatory Visit
Admission: RE | Admit: 2023-06-11 | Discharge: 2023-06-11 | Disposition: A | Discharge status: Home / Self Care | Source: Ambulatory Visit | Attending: Radiation Oncology

## 2023-06-11 ENCOUNTER — Other Ambulatory Visit: Payer: Self-pay

## 2023-06-11 DIAGNOSIS — Z510 Encounter for antineoplastic radiation therapy: Secondary | ICD-10-CM | POA: Diagnosis not present

## 2023-06-11 LAB — RAD ONC ARIA SESSION SUMMARY
Course Elapsed Days: 20
Plan Fractions Treated to Date: 13
Plan Prescribed Dose Per Fraction: 1.8 Gy
Plan Total Fractions Prescribed: 25
Plan Total Prescribed Dose: 45 Gy
Reference Point Dosage Given to Date: 23.4 Gy
Reference Point Session Dosage Given: 1.8 Gy
Session Number: 13

## 2023-06-12 ENCOUNTER — Ambulatory Visit
Admission: RE | Admit: 2023-06-12 | Discharge: 2023-06-12 | Disposition: A | Discharge status: Home / Self Care | Source: Ambulatory Visit | Attending: Radiation Oncology

## 2023-06-12 ENCOUNTER — Other Ambulatory Visit: Payer: Self-pay

## 2023-06-12 DIAGNOSIS — R945 Abnormal results of liver function studies: Secondary | ICD-10-CM | POA: Insufficient documentation

## 2023-06-12 DIAGNOSIS — Z510 Encounter for antineoplastic radiation therapy: Secondary | ICD-10-CM | POA: Diagnosis not present

## 2023-06-12 LAB — RAD ONC ARIA SESSION SUMMARY
Course Elapsed Days: 21
Plan Fractions Treated to Date: 14
Plan Prescribed Dose Per Fraction: 1.8 Gy
Plan Total Fractions Prescribed: 25
Plan Total Prescribed Dose: 45 Gy
Reference Point Dosage Given to Date: 25.2 Gy
Reference Point Session Dosage Given: 1.8 Gy
Session Number: 14

## 2023-06-12 NOTE — Assessment & Plan Note (Addendum)
 Weakness, falls, hypokalemia required hospitalization.  We stopped AAP.  ADT for 2 years if able to tolerate Follow-up with radiation oncology team as recommended May continue follow up with Dr. Orin Birk for future PSA monitoring post-treatment

## 2023-06-12 NOTE — Assessment & Plan Note (Addendum)
 Continue amlodipine 10 mg Continue valsartan  160 mg daily. Refilled today

## 2023-06-12 NOTE — Progress Notes (Unsigned)
 [facility] OFFICE PROGRESS NOTE  Patient Care Team: Doak Free, MD as PCP - General (Internal Medicine) Katheleen Palmer, RN as Oncology Nurse Navigator  Jaevian is a 78 y.o.male with history of hypertension, hyperlipidemia being seen at [facility] Oncology Clinic for prostate cancer.   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   Germline testing: not yet Somatic testing: not yet Treatment:  8 weeks of external beam therapy concurrent with LT-ADT  04/13/23 started AAP   Discussed risk and benefit of AAP. Given his age, complication resulted in hospitalization we elected to discontinue. Will monitor after RT and continue ADT as tolerated.   [facility] is getting [facility]. Last dose report a six month dosage Assessment & Plan Prostate cancer (HCC) Weakness, falls, hypokalemia required hospitalization.  We stopped AAP.  ADT for 2 years if able to tolerate Follow-up with radiation oncology team as recommended Secondary hypertension Continue amlodipine 10 mg Continue valsartan  160 mg daily Hypokalemia Check BMP today. Continue oral potassium Follow up Abnormal liver function Labs showed   No orders of the defined types were placed in this encounter.    Lowanda Ruddy, MD  INTERVAL HISTORY: Patient returns for follow-up.  Oncology History  Prostate cancer [facility])  01/04/2023 Pathology Results   MRI fusion biopsy  14/15 core biopsies positive.  The maximum Gleason score was 4+5 and multiple 4+4, and 4+3 prostate adenocarcinoma   02/04/2023 PET scan   PSMA PET 1. Intense radiotracer activity within the prostate gland consistent primary prostate adenocarcinoma. 2. Metastatic lymph node posterior RIGHT of the rectum as described above. Lymph nodes: Radiotracer avid lymph node in the mesorectal sheath posterior RIGHT of the rectum measuring 8 mm on image 168  with SUV max equal 11.7. 3. No evidence of metastatic adenopathy outside the pelvis. 4. No visceral metastasis or skeletal metastasis.   02/25/2023 -  Chemotherapy   started on ADT with Firmagon on 02/25/23.    02/26/2023 Initial Diagnosis   Malignant neoplasm of prostate (HCC)   02/26/2023 Cancer Staging   Staging form: Prostate, AJCC 8th Edition - Clinical stage from 02/26/2023: Stage IVA (cT1c, cN1, cM0, PSA: 18.7, Grade Group: 5) - Signed by Kenith Payer, MD on 02/26/2023 Histopathologic type: Adenocarcinoma, NOS Stage prefix: Initial diagnosis Prostate specific antigen (PSA) range: 10 to 19 Gleason primary pattern: 4 Gleason secondary pattern: 5 Gleason score: 9 Histologic grading system: 5 grade system      PHYSICAL EXAMINATION: ECOG PERFORMANCE STATUS: {CHL ONC ECOG [phone]}  There were no vitals filed for this visit. There were no vitals filed for this visit.  Relevant data reviewed during this visit included ***

## 2023-06-12 NOTE — Assessment & Plan Note (Addendum)
 Check BMP today. Continue oral potassium, decrease to 20 mEQ twice daily Follow up in 2 weeks with BMP.

## 2023-06-12 NOTE — Assessment & Plan Note (Addendum)
 Labs showed normaliziation

## 2023-06-13 ENCOUNTER — Ambulatory Visit
Admission: RE | Admit: 2023-06-13 | Discharge: 2023-06-13 | Disposition: A | Discharge status: Home / Self Care | Source: Ambulatory Visit | Attending: Radiation Oncology | Admitting: Radiation Oncology

## 2023-06-13 ENCOUNTER — Other Ambulatory Visit: Payer: Self-pay

## 2023-06-13 DIAGNOSIS — Z510 Encounter for antineoplastic radiation therapy: Secondary | ICD-10-CM | POA: Diagnosis not present

## 2023-06-13 LAB — RAD ONC ARIA SESSION SUMMARY
Course Elapsed Days: 22
Plan Fractions Treated to Date: 15
Plan Prescribed Dose Per Fraction: 1.8 Gy
Plan Total Fractions Prescribed: 25
Plan Total Prescribed Dose: 45 Gy
Reference Point Dosage Given to Date: 27 Gy
Reference Point Session Dosage Given: 1.8 Gy
Session Number: 15

## 2023-06-14 ENCOUNTER — Ambulatory Visit
Admission: RE | Admit: 2023-06-14 | Discharge: 2023-06-14 | Disposition: A | Discharge status: Home / Self Care | Source: Ambulatory Visit | Attending: Radiation Oncology | Admitting: Radiation Oncology

## 2023-06-14 ENCOUNTER — Inpatient Hospital Stay

## 2023-06-14 ENCOUNTER — Inpatient Hospital Stay (HOSPITAL_BASED_OUTPATIENT_CLINIC_OR_DEPARTMENT_OTHER)

## 2023-06-14 ENCOUNTER — Other Ambulatory Visit: Payer: Self-pay

## 2023-06-14 VITALS — BP 156/68 | HR 70 | Temp 97.9°F | Resp 14 | Wt 245.5 lb

## 2023-06-14 DIAGNOSIS — R945 Abnormal results of liver function studies: Secondary | ICD-10-CM

## 2023-06-14 DIAGNOSIS — R748 Abnormal levels of other serum enzymes: Secondary | ICD-10-CM

## 2023-06-14 DIAGNOSIS — I159 Secondary hypertension, unspecified: Secondary | ICD-10-CM

## 2023-06-14 DIAGNOSIS — C61 Malignant neoplasm of prostate: Secondary | ICD-10-CM

## 2023-06-14 DIAGNOSIS — Z510 Encounter for antineoplastic radiation therapy: Secondary | ICD-10-CM | POA: Diagnosis not present

## 2023-06-14 DIAGNOSIS — E876 Hypokalemia: Secondary | ICD-10-CM

## 2023-06-14 LAB — CMP (CANCER CENTER ONLY)
ALT: 20 U/L (ref 0–44)
AST: 14 U/L — ABNORMAL LOW (ref 15–41)
Albumin: 3.5 g/dL (ref 3.5–5.0)
Alkaline Phosphatase: 56 U/L (ref 38–126)
Anion gap: 4 — ABNORMAL LOW (ref 5–15)
BUN: 13 mg/dL (ref 8–23)
CO2: 31 mmol/L (ref 22–32)
Calcium: 9.1 mg/dL (ref 8.9–10.3)
Chloride: 110 mmol/L (ref 98–111)
Creatinine: 1.13 mg/dL (ref 0.61–1.24)
GFR, Estimated: >60 mL/min (ref >60)
Glucose, Bld: 110 mg/dL — ABNORMAL HIGH (ref 70–99)
Potassium: 3.7 mmol/L (ref 3.5–5.1)
Sodium: 145 mmol/L (ref 135–145)
Total Bilirubin: 0.3 mg/dL (ref 0.0–1.2)
Total Protein: 6.4 g/dL — ABNORMAL LOW (ref 6.5–8.1)

## 2023-06-14 LAB — RAD ONC ARIA SESSION SUMMARY
Course Elapsed Days: 23
Plan Fractions Treated to Date: 16
Plan Prescribed Dose Per Fraction: 1.8 Gy
Plan Total Fractions Prescribed: 25
Plan Total Prescribed Dose: 45 Gy
Reference Point Dosage Given to Date: 28.8 Gy
Reference Point Session Dosage Given: 1.8 Gy
Session Number: 16

## 2023-06-17 ENCOUNTER — Ambulatory Visit
Admission: RE | Admit: 2023-06-17 | Discharge: 2023-06-17 | Disposition: A | Discharge status: Home / Self Care | Source: Ambulatory Visit | Attending: Radiation Oncology | Admitting: Radiation Oncology

## 2023-06-17 ENCOUNTER — Other Ambulatory Visit: Payer: Self-pay

## 2023-06-17 DIAGNOSIS — Z510 Encounter for antineoplastic radiation therapy: Secondary | ICD-10-CM | POA: Diagnosis present

## 2023-06-17 DIAGNOSIS — C61 Malignant neoplasm of prostate: Secondary | ICD-10-CM | POA: Insufficient documentation

## 2023-06-17 LAB — RAD ONC ARIA SESSION SUMMARY
Course Elapsed Days: 26
Plan Fractions Treated to Date: 17
Plan Prescribed Dose Per Fraction: 1.8 Gy
Plan Total Fractions Prescribed: 25
Plan Total Prescribed Dose: 45 Gy
Reference Point Dosage Given to Date: 30.6 Gy
Reference Point Session Dosage Given: 1.8 Gy
Session Number: 17

## 2023-06-18 ENCOUNTER — Ambulatory Visit

## 2023-06-19 ENCOUNTER — Ambulatory Visit
Admission: RE | Admit: 2023-06-19 | Discharge: 2023-06-19 | Disposition: A | Discharge status: Home / Self Care | Source: Ambulatory Visit | Attending: Radiation Oncology

## 2023-06-19 ENCOUNTER — Other Ambulatory Visit: Payer: Self-pay

## 2023-06-19 DIAGNOSIS — Z510 Encounter for antineoplastic radiation therapy: Secondary | ICD-10-CM | POA: Diagnosis not present

## 2023-06-19 LAB — RAD ONC ARIA SESSION SUMMARY
Course Elapsed Days: 28
Plan Fractions Treated to Date: 18
Plan Prescribed Dose Per Fraction: 1.8 Gy
Plan Total Fractions Prescribed: 25
Plan Total Prescribed Dose: 45 Gy
Reference Point Dosage Given to Date: 32.4 Gy
Reference Point Session Dosage Given: 1.8 Gy
Session Number: 18

## 2023-06-20 ENCOUNTER — Other Ambulatory Visit: Payer: Self-pay

## 2023-06-20 ENCOUNTER — Ambulatory Visit
Admission: RE | Admit: 2023-06-20 | Discharge: 2023-06-20 | Disposition: A | Discharge status: Home / Self Care | Source: Ambulatory Visit | Attending: Radiation Oncology

## 2023-06-20 DIAGNOSIS — Z510 Encounter for antineoplastic radiation therapy: Secondary | ICD-10-CM | POA: Diagnosis not present

## 2023-06-20 LAB — RAD ONC ARIA SESSION SUMMARY
Course Elapsed Days: 29
Plan Fractions Treated to Date: 19
Plan Prescribed Dose Per Fraction: 1.8 Gy
Plan Total Fractions Prescribed: 25
Plan Total Prescribed Dose: 45 Gy
Reference Point Dosage Given to Date: 34.2 Gy
Reference Point Session Dosage Given: 1.8 Gy
Session Number: 19

## 2023-06-21 ENCOUNTER — Ambulatory Visit
Admission: RE | Admit: 2023-06-21 | Discharge: 2023-06-21 | Disposition: A | Discharge status: Home / Self Care | Source: Ambulatory Visit | Attending: Radiation Oncology | Admitting: Radiation Oncology

## 2023-06-21 ENCOUNTER — Other Ambulatory Visit: Payer: Self-pay

## 2023-06-21 DIAGNOSIS — Z510 Encounter for antineoplastic radiation therapy: Secondary | ICD-10-CM | POA: Diagnosis not present

## 2023-06-21 LAB — RAD ONC ARIA SESSION SUMMARY
Course Elapsed Days: 30
Plan Fractions Treated to Date: 20
Plan Prescribed Dose Per Fraction: 1.8 Gy
Plan Total Fractions Prescribed: 25
Plan Total Prescribed Dose: 45 Gy
Reference Point Dosage Given to Date: 36 Gy
Reference Point Session Dosage Given: 1.8 Gy
Session Number: 20

## 2023-06-24 ENCOUNTER — Ambulatory Visit

## 2023-06-25 ENCOUNTER — Other Ambulatory Visit: Payer: Self-pay

## 2023-06-25 ENCOUNTER — Ambulatory Visit
Admission: RE | Admit: 2023-06-25 | Discharge: 2023-06-25 | Disposition: A | Discharge status: Home / Self Care | Source: Ambulatory Visit | Attending: Radiation Oncology | Admitting: Radiation Oncology

## 2023-06-25 DIAGNOSIS — Z510 Encounter for antineoplastic radiation therapy: Secondary | ICD-10-CM | POA: Diagnosis not present

## 2023-06-25 LAB — RAD ONC ARIA SESSION SUMMARY
Course Elapsed Days: 34
Plan Fractions Treated to Date: 21
Plan Prescribed Dose Per Fraction: 1.8 Gy
Plan Total Fractions Prescribed: 25
Plan Total Prescribed Dose: 45 Gy
Reference Point Dosage Given to Date: 37.8 Gy
Reference Point Session Dosage Given: 1.8 Gy
Session Number: 21

## 2023-06-26 ENCOUNTER — Ambulatory Visit
Admission: RE | Admit: 2023-06-26 | Discharge: 2023-06-26 | Disposition: A | Discharge status: Home / Self Care | Source: Ambulatory Visit | Attending: Radiation Oncology | Admitting: Radiation Oncology

## 2023-06-26 ENCOUNTER — Other Ambulatory Visit: Payer: Self-pay

## 2023-06-26 DIAGNOSIS — Z510 Encounter for antineoplastic radiation therapy: Secondary | ICD-10-CM | POA: Diagnosis not present

## 2023-06-26 LAB — RAD ONC ARIA SESSION SUMMARY
Course Elapsed Days: 35
Plan Fractions Treated to Date: 22
Plan Prescribed Dose Per Fraction: 1.8 Gy
Plan Total Fractions Prescribed: 25
Plan Total Prescribed Dose: 45 Gy
Reference Point Dosage Given to Date: 39.6 Gy
Reference Point Session Dosage Given: 1.8 Gy
Session Number: 22

## 2023-06-27 ENCOUNTER — Ambulatory Visit
Admission: RE | Admit: 2023-06-27 | Discharge: 2023-06-27 | Disposition: A | Discharge status: Home / Self Care | Payer: Self-pay | Source: Ambulatory Visit | Attending: Radiation Oncology | Admitting: Radiation Oncology

## 2023-06-27 ENCOUNTER — Other Ambulatory Visit: Payer: Self-pay

## 2023-06-27 ENCOUNTER — Ambulatory Visit

## 2023-06-27 DIAGNOSIS — E876 Hypokalemia: Secondary | ICD-10-CM

## 2023-06-27 DIAGNOSIS — Z510 Encounter for antineoplastic radiation therapy: Secondary | ICD-10-CM | POA: Diagnosis not present

## 2023-06-27 LAB — RAD ONC ARIA SESSION SUMMARY
Course Elapsed Days: 36
Plan Fractions Treated to Date: 23
Plan Prescribed Dose Per Fraction: 1.8 Gy
Plan Total Fractions Prescribed: 25
Plan Total Prescribed Dose: 45 Gy
Reference Point Dosage Given to Date: 41.4 Gy
Reference Point Session Dosage Given: 1.8 Gy
Session Number: 23

## 2023-06-28 ENCOUNTER — Inpatient Hospital Stay

## 2023-06-28 ENCOUNTER — Ambulatory Visit
Admission: RE | Admit: 2023-06-28 | Discharge: 2023-06-28 | Disposition: A | Discharge status: Home / Self Care | Source: Ambulatory Visit | Attending: Radiation Oncology | Admitting: Radiation Oncology

## 2023-06-28 ENCOUNTER — Inpatient Hospital Stay (HOSPITAL_BASED_OUTPATIENT_CLINIC_OR_DEPARTMENT_OTHER)

## 2023-06-28 ENCOUNTER — Other Ambulatory Visit: Payer: Self-pay

## 2023-06-28 ENCOUNTER — Ambulatory Visit

## 2023-06-28 VITALS — BP 144/71 | HR 70 | Temp 97.7°F | Resp 18 | Ht 66.0 in | Wt 241.8 lb

## 2023-06-28 DIAGNOSIS — C775 Secondary and unspecified malignant neoplasm of intrapelvic lymph nodes: Secondary | ICD-10-CM | POA: Insufficient documentation

## 2023-06-28 DIAGNOSIS — C61 Malignant neoplasm of prostate: Secondary | ICD-10-CM | POA: Insufficient documentation

## 2023-06-28 DIAGNOSIS — Z79899 Other long term (current) drug therapy: Secondary | ICD-10-CM | POA: Diagnosis not present

## 2023-06-28 DIAGNOSIS — I159 Secondary hypertension, unspecified: Secondary | ICD-10-CM | POA: Diagnosis not present

## 2023-06-28 DIAGNOSIS — E876 Hypokalemia: Secondary | ICD-10-CM

## 2023-06-28 DIAGNOSIS — M85851 Other specified disorders of bone density and structure, right thigh: Secondary | ICD-10-CM | POA: Insufficient documentation

## 2023-06-28 DIAGNOSIS — Z510 Encounter for antineoplastic radiation therapy: Secondary | ICD-10-CM | POA: Diagnosis not present

## 2023-06-28 LAB — CMP (CANCER CENTER ONLY)
ALT: 16 U/L (ref 0–44)
AST: 14 U/L — ABNORMAL LOW (ref 15–41)
Albumin: 3.8 g/dL (ref 3.5–5.0)
Alkaline Phosphatase: 48 U/L (ref 38–126)
Anion gap: 7 (ref 5–15)
BUN: 17 mg/dL (ref 8–23)
CO2: 28 mmol/L (ref 22–32)
Calcium: 9.4 mg/dL (ref 8.9–10.3)
Chloride: 111 mmol/L (ref 98–111)
Creatinine: 1.02 mg/dL (ref 0.61–1.24)
GFR, Estimated: >60 mL/min (ref >60)
Glucose, Bld: 122 mg/dL — ABNORMAL HIGH (ref 70–99)
Potassium: 3.7 mmol/L (ref 3.5–5.1)
Sodium: 146 mmol/L — ABNORMAL HIGH (ref 135–145)
Total Bilirubin: 0.3 mg/dL (ref 0.0–1.2)
Total Protein: 6.8 g/dL (ref 6.5–8.1)

## 2023-06-28 LAB — RAD ONC ARIA SESSION SUMMARY
Course Elapsed Days: 37
Plan Fractions Treated to Date: 24
Plan Prescribed Dose Per Fraction: 1.8 Gy
Plan Total Fractions Prescribed: 25
Plan Total Prescribed Dose: 45 Gy
Reference Point Dosage Given to Date: 43.2 Gy
Reference Point Session Dosage Given: 1.8 Gy
Session Number: 24

## 2023-06-28 LAB — MAGNESIUM: Magnesium: 1.8 mg/dL (ref 1.7–2.4)

## 2023-06-28 MED ORDER — POTASSIUM CHLORIDE CRYS ER 10 MEQ PO TBCR: 20.0000 meq | EXTENDED_RELEASE_TABLET | Freq: Every day | ORAL | 1 refills | Status: DC

## 2023-06-28 NOTE — Assessment & Plan Note (Addendum)
 Continue amlodipine  10 mg BP 130-144 with amlodipine  only. Will hold on valsartan  and record BP daily at home. Bring in 2 weeks.

## 2023-06-28 NOTE — Assessment & Plan Note (Addendum)
 Normal today Continue oral potassium, decrease to 20 mEQ daily. New script sent Follow up in 2 weeks with BMP.

## 2023-06-28 NOTE — Assessment & Plan Note (Addendum)
 Weakness, falls, hypokalemia required hospitalization.  Avoid AAP in the future ADT for 2 years if able to tolerate Follow-up with radiation oncology team as recommended Will obtain Dexa scan as baseline May continue follow up with Dr. Orin Birk for future PSA monitoring post-treatment

## 2023-06-28 NOTE — Progress Notes (Signed)
 [facility] OFFICE PROGRESS NOTE  Patient Care Team: Doak Free, Chase as PCP - General (Internal Medicine) Katheleen Cook, Chase Cook is a 78 y.o.male with history of hypertension, hyperlipidemia being seen at [facility] for prostate cancer.  Addendum:  Dexa scan 06/28/23.  Osteopenia right femoral neck T-score -1.6. 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   Germline testing: not yet Somatic testing: not yet Treatment:  8 weeks of external beam therapy concurrent with LT-ADT. Will finish on 7/8. 04/13/23 started AAP   Discussed risk and benefit of AAP. Given his age, complication resulted in hospitalization we elected to discontinue. Will monitor after RT and continue ADT as tolerated.   Clinical more tired as expected. Continue oral hydration and intake as able.  Assessment & Plan Prostate cancer (HCC) Weakness, falls, hypokalemia required hospitalization.  Avoid AAP in the future ADT for 2 years if able to tolerate Follow-up with radiation oncology team as recommended Will obtain Dexa scan as baseline May continue follow up with Dr. Orin Birk for future PSA monitoring post-treatment Secondary hypertension Continue amlodipine  10 mg BP 130-144 with amlodipine  only. Will hold on valsartan  and record BP daily at home. Bring in 2 weeks. Hypokalemia Normal today Continue oral potassium, decrease to 20 mEQ daily. New script sent Follow up in 2 weeks with BMP.  Orders Placed This Encounter  Procedures   Basic Metabolic Panel - Cancer Center Only    Standing Status:   Future    Expiration Date:   06/27/2024     Lowanda Ruddy, Chase  INTERVAL HISTORY: Patient returns for follow-up. Report drinking a lot of fluid. No trouble urinating. Energy is lower.  No stomach pain, or diarrhea.  Oncology  History  Prostate cancer [facility])  01/04/2023 Pathology Results   MRI fusion biopsy  14/15 core biopsies positive.  The maximum Gleason score was 4+5 and multiple 4+4, and 4+3 prostate adenocarcinoma   02/04/2023 PET scan   PSMA PET 1. Intense radiotracer activity within the prostate gland consistent primary prostate adenocarcinoma. 2. Metastatic lymph node posterior RIGHT of the rectum as described above. Lymph nodes: Radiotracer avid lymph node in the mesorectal sheath posterior RIGHT of the rectum measuring 8 mm on image 168 with SUV max equal 11.7. 3. No evidence of metastatic adenopathy outside the pelvis. 4. No visceral metastasis or skeletal metastasis.   02/25/2023 -  Chemotherapy   started on ADT with Firmagon on 02/25/23.    02/26/2023 Initial Diagnosis   Malignant neoplasm of prostate (HCC)   02/26/2023 Cancer Staging   Staging form: Prostate, AJCC 8th Edition - Clinical stage from 02/26/2023: Stage IVA (cT1c, cN1, cM0, PSA: 18.7, Grade Group: 5) - Signed by Kenith Payer, Chase on 02/26/2023 Histopathologic type: Adenocarcinoma, NOS Stage prefix: Initial diagnosis Prostate specific antigen (PSA) range: 10 to 19 Gleason primary pattern: 4 Gleason secondary pattern: 5 Gleason score: 9 Histologic grading system: 5 grade system      PHYSICAL EXAMINATION: ECOG PERFORMANCE STATUS: 1 - Symptomatic but completely ambulatory  Vitals:   06/28/23 1009  BP: (!) 144/71  Pulse: 70  Resp: 18  Temp: 97.7 F (36.5 C)  SpO2: 96%   Filed Weights   06/28/23 1009  Weight: 241 lb 12.8 oz (109.7 kg)    Relevant data reviewed during this visit included labs.

## 2023-07-01 ENCOUNTER — Ambulatory Visit
Admission: RE | Admit: 2023-07-01 | Discharge: 2023-07-01 | Disposition: A | Discharge status: Home / Self Care | Source: Ambulatory Visit | Attending: Radiation Oncology | Admitting: Radiation Oncology

## 2023-07-01 ENCOUNTER — Other Ambulatory Visit: Payer: Self-pay

## 2023-07-01 ENCOUNTER — Ambulatory Visit

## 2023-07-01 DIAGNOSIS — Z510 Encounter for antineoplastic radiation therapy: Secondary | ICD-10-CM | POA: Diagnosis not present

## 2023-07-01 LAB — RAD ONC ARIA SESSION SUMMARY
Course Elapsed Days: 40
Plan Fractions Treated to Date: 25
Plan Prescribed Dose Per Fraction: 1.8 Gy
Plan Total Fractions Prescribed: 25
Plan Total Prescribed Dose: 45 Gy
Reference Point Dosage Given to Date: 45 Gy
Reference Point Session Dosage Given: 1.8 Gy
Session Number: 25

## 2023-07-02 ENCOUNTER — Ambulatory Visit
Admission: RE | Admit: 2023-07-02 | Discharge: 2023-07-02 | Disposition: A | Discharge status: Home / Self Care | Source: Ambulatory Visit | Attending: Radiation Oncology | Admitting: Radiation Oncology

## 2023-07-02 ENCOUNTER — Other Ambulatory Visit: Payer: Self-pay

## 2023-07-02 ENCOUNTER — Ambulatory Visit

## 2023-07-02 DIAGNOSIS — Z510 Encounter for antineoplastic radiation therapy: Secondary | ICD-10-CM | POA: Diagnosis not present

## 2023-07-02 LAB — RAD ONC ARIA SESSION SUMMARY
Course Elapsed Days: 41
Plan Fractions Treated to Date: 1
Plan Prescribed Dose Per Fraction: 2 Gy
Plan Total Fractions Prescribed: 15
Plan Total Prescribed Dose: 30 Gy
Reference Point Dosage Given to Date: 2 Gy
Reference Point Session Dosage Given: 2 Gy
Session Number: 26

## 2023-07-03 ENCOUNTER — Ambulatory Visit
Admission: RE | Admit: 2023-07-03 | Discharge: 2023-07-03 | Disposition: A | Discharge status: Home / Self Care | Source: Ambulatory Visit | Attending: Radiation Oncology | Admitting: Radiation Oncology

## 2023-07-03 ENCOUNTER — Other Ambulatory Visit: Payer: Self-pay

## 2023-07-03 DIAGNOSIS — Z510 Encounter for antineoplastic radiation therapy: Secondary | ICD-10-CM | POA: Diagnosis not present

## 2023-07-03 LAB — RAD ONC ARIA SESSION SUMMARY
Course Elapsed Days: 42
Plan Fractions Treated to Date: 2
Plan Prescribed Dose Per Fraction: 2 Gy
Plan Total Fractions Prescribed: 15
Plan Total Prescribed Dose: 30 Gy
Reference Point Dosage Given to Date: 4 Gy
Reference Point Session Dosage Given: 2 Gy
Session Number: 27

## 2023-07-04 ENCOUNTER — Ambulatory Visit

## 2023-07-05 ENCOUNTER — Ambulatory Visit
Admission: RE | Admit: 2023-07-05 | Discharge: 2023-07-05 | Disposition: A | Discharge status: Home / Self Care | Source: Ambulatory Visit | Attending: Radiation Oncology | Admitting: Radiation Oncology

## 2023-07-05 ENCOUNTER — Other Ambulatory Visit: Payer: Self-pay

## 2023-07-05 DIAGNOSIS — Z510 Encounter for antineoplastic radiation therapy: Secondary | ICD-10-CM | POA: Diagnosis not present

## 2023-07-05 LAB — RAD ONC ARIA SESSION SUMMARY
Course Elapsed Days: 44
Plan Fractions Treated to Date: 3
Plan Prescribed Dose Per Fraction: 2 Gy
Plan Total Fractions Prescribed: 15
Plan Total Prescribed Dose: 30 Gy
Reference Point Dosage Given to Date: 6 Gy
Reference Point Session Dosage Given: 2 Gy
Session Number: 28

## 2023-07-08 ENCOUNTER — Other Ambulatory Visit: Payer: Self-pay

## 2023-07-08 ENCOUNTER — Ambulatory Visit
Admission: RE | Admit: 2023-07-08 | Discharge: 2023-07-08 | Disposition: A | Discharge status: Home / Self Care | Source: Ambulatory Visit | Attending: Radiation Oncology | Admitting: Radiation Oncology

## 2023-07-08 DIAGNOSIS — Z510 Encounter for antineoplastic radiation therapy: Secondary | ICD-10-CM | POA: Diagnosis not present

## 2023-07-08 LAB — RAD ONC ARIA SESSION SUMMARY
Course Elapsed Days: 47
Plan Fractions Treated to Date: 4
Plan Prescribed Dose Per Fraction: 2 Gy
Plan Total Fractions Prescribed: 15
Plan Total Prescribed Dose: 30 Gy
Reference Point Dosage Given to Date: 8 Gy
Reference Point Session Dosage Given: 2 Gy
Session Number: 29

## 2023-07-09 ENCOUNTER — Other Ambulatory Visit: Payer: Self-pay

## 2023-07-09 ENCOUNTER — Ambulatory Visit
Admission: RE | Admit: 2023-07-09 | Discharge: 2023-07-09 | Disposition: A | Discharge status: Home / Self Care | Source: Ambulatory Visit | Attending: Radiation Oncology | Admitting: Radiation Oncology

## 2023-07-09 DIAGNOSIS — Z510 Encounter for antineoplastic radiation therapy: Secondary | ICD-10-CM | POA: Diagnosis not present

## 2023-07-09 LAB — RAD ONC ARIA SESSION SUMMARY
Course Elapsed Days: 48
Plan Fractions Treated to Date: 5
Plan Prescribed Dose Per Fraction: 2 Gy
Plan Total Fractions Prescribed: 15
Plan Total Prescribed Dose: 30 Gy
Reference Point Dosage Given to Date: 10 Gy
Reference Point Session Dosage Given: 2 Gy
Session Number: 30

## 2023-07-10 ENCOUNTER — Ambulatory Visit
Admission: RE | Admit: 2023-07-10 | Discharge: 2023-07-10 | Disposition: A | Discharge status: Home / Self Care | Source: Ambulatory Visit | Attending: Radiation Oncology | Admitting: Radiation Oncology

## 2023-07-10 ENCOUNTER — Other Ambulatory Visit: Payer: Self-pay

## 2023-07-10 DIAGNOSIS — Z510 Encounter for antineoplastic radiation therapy: Secondary | ICD-10-CM | POA: Diagnosis not present

## 2023-07-10 LAB — RAD ONC ARIA SESSION SUMMARY
Course Elapsed Days: 49
Plan Fractions Treated to Date: 6
Plan Prescribed Dose Per Fraction: 2 Gy
Plan Total Fractions Prescribed: 15
Plan Total Prescribed Dose: 30 Gy
Reference Point Dosage Given to Date: 12 Gy
Reference Point Session Dosage Given: 2 Gy
Session Number: 31

## 2023-07-11 ENCOUNTER — Ambulatory Visit
Admission: RE | Admit: 2023-07-11 | Discharge: 2023-07-11 | Disposition: A | Discharge status: Home / Self Care | Source: Ambulatory Visit | Attending: Radiation Oncology | Admitting: Radiation Oncology

## 2023-07-11 ENCOUNTER — Other Ambulatory Visit: Payer: Self-pay

## 2023-07-11 DIAGNOSIS — Z510 Encounter for antineoplastic radiation therapy: Secondary | ICD-10-CM | POA: Diagnosis not present

## 2023-07-11 LAB — RAD ONC ARIA SESSION SUMMARY
Course Elapsed Days: 50
Plan Fractions Treated to Date: 7
Plan Prescribed Dose Per Fraction: 2 Gy
Plan Total Fractions Prescribed: 15
Plan Total Prescribed Dose: 30 Gy
Reference Point Dosage Given to Date: 14 Gy
Reference Point Session Dosage Given: 2 Gy
Session Number: 32

## 2023-07-11 NOTE — Progress Notes (Signed)
 [facility] OFFICE PROGRESS NOTE  Patient Care Team: Chase Area, MD as PCP - General (Internal Medicine) Chase Pont, RN as Oncology Nurse Navigator  Chase Cook is a 78 y.o.male with history of hypertension, hyperlipidemia being seen at [facility] for prostate cancer.   Addendum:  Dexa scan 06/28/23.  Osteopenia right femoral neck T-score -1.6. 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   Germline testing: not yet Somatic testing: not yet Treatment:  8 weeks of external beam therapy concurrent with LT-ADT. Will finish on 7/8. 04/13/23 started AAP  Developed generalized weakness, fall, hypokalemia resulted in hospitalization. Also has significant HTN. Discussed risk and benefit of AAP. Given his age, complication resulted in hospitalization we elected to discontinue. May continue monitor after RT and continue ADT with Urology as tolerated.  Assessment & Plan Hypokalemia Continue potassium 20 mEq nightly Refill today Prostate cancer (HCC) Weakness, falls, hypokalemia required hospitalization.  Avoid AAP in the future ADT for 2-3 years if able to tolerate Follow-up with radiation oncology team as recommended Will obtain Dexa scan as baseline May continue follow up with Dr. Iantha for future ADT and PSA monitoring post-treatment Secondary hypertension On both valsartan  160 mg and amlodipine  10 mg Report BP 130-144 with both medications Continue medication. High cholesterol Patient has been on Lipitor.    Orders Placed This Encounter  Procedures   CBC with Differential ([facility] Only)    Standing Status:   Future    Expiration Date:   07/11/2024   CMP ([facility] only)    Standing Status:   Future    Expiration Date:   07/11/2024     Chase JAYSON Chihuahua, MD  INTERVAL HISTORY: Patient returns for follow-up. Some fatigue  from radiation. He drinks about 8 glasses of water. Some dribbing when urinating. He is taking tamsulosin . No dysuria or frequency like he had UTI. No chest pain, palpitation.  BP is about the same at home. He is taking 20 mEq potassium at night.  Oncology History  Prostate cancer [facility])  01/04/2023 Pathology Results   MRI fusion biopsy  14/15 core biopsies positive.  The maximum Gleason score was 4+5 and multiple 4+4, and 4+3 prostate adenocarcinoma   02/04/2023 PET scan   PSMA PET 1. Intense radiotracer activity within the prostate gland consistent primary prostate adenocarcinoma. 2. Metastatic lymph node posterior RIGHT of the rectum as described above. Lymph nodes: Radiotracer avid lymph node in the mesorectal sheath posterior RIGHT of the rectum measuring 8 mm on image 168 with SUV max equal 11.7. 3. No evidence of metastatic adenopathy outside the pelvis. 4. No visceral metastasis or skeletal metastasis.   02/25/2023 -  Chemotherapy   started on ADT with Firmagon on 02/25/23.    02/26/2023 Initial Diagnosis   Malignant neoplasm of prostate (HCC)   02/26/2023 Cancer Staging   Staging form: Prostate, AJCC 8th Edition - Clinical stage from 02/26/2023: Stage IVA (cT1c, cN1, cM0, PSA: 18.7, Grade Group: 5) - Signed by Patrcia Cough, MD on 02/26/2023 Histopathologic type: Adenocarcinoma, NOS Stage prefix: Initial diagnosis Prostate specific antigen (PSA) range: 10 to 19 Gleason primary pattern: 4 Gleason secondary pattern: 5 Gleason score: 9 Histologic grading system: 5 grade system      PHYSICAL EXAMINATION: ECOG PERFORMANCE STATUS: 2 - Symptomatic, <50% confined to bed  Vitals:   07/12/23 0951  BP: 137/76  Pulse: 76  Resp: 17  Temp: 97.6 F (36.4 C)  SpO2: 97%   Filed Weights   07/12/23 0951  Weight: 238 lb 3.2 oz (108 kg)   Gen: No distress Ext: Minimal bilateral lower extremity edema  Relevant data reviewed during this visit included labs.

## 2023-07-12 ENCOUNTER — Ambulatory Visit

## 2023-07-12 ENCOUNTER — Inpatient Hospital Stay

## 2023-07-12 ENCOUNTER — Inpatient Hospital Stay (HOSPITAL_BASED_OUTPATIENT_CLINIC_OR_DEPARTMENT_OTHER)

## 2023-07-12 ENCOUNTER — Ambulatory Visit
Admission: RE | Admit: 2023-07-12 | Discharge: 2023-07-12 | Disposition: A | Discharge status: Home / Self Care | Source: Ambulatory Visit | Attending: Radiation Oncology | Admitting: Radiation Oncology

## 2023-07-12 ENCOUNTER — Other Ambulatory Visit: Payer: Self-pay

## 2023-07-12 VITALS — BP 137/76 | HR 76 | Temp 97.6°F | Resp 17 | Wt 238.2 lb

## 2023-07-12 DIAGNOSIS — Z9189 Other specified personal risk factors, not elsewhere classified: Secondary | ICD-10-CM

## 2023-07-12 DIAGNOSIS — E876 Hypokalemia: Secondary | ICD-10-CM | POA: Diagnosis not present

## 2023-07-12 DIAGNOSIS — C61 Malignant neoplasm of prostate: Secondary | ICD-10-CM | POA: Diagnosis not present

## 2023-07-12 DIAGNOSIS — E7800 Pure hypercholesterolemia, unspecified: Secondary | ICD-10-CM

## 2023-07-12 DIAGNOSIS — I159 Secondary hypertension, unspecified: Secondary | ICD-10-CM | POA: Diagnosis not present

## 2023-07-12 DIAGNOSIS — Z510 Encounter for antineoplastic radiation therapy: Secondary | ICD-10-CM | POA: Diagnosis not present

## 2023-07-12 LAB — RAD ONC ARIA SESSION SUMMARY
Course Elapsed Days: 51
Plan Fractions Treated to Date: 8
Plan Prescribed Dose Per Fraction: 2 Gy
Plan Total Fractions Prescribed: 15
Plan Total Prescribed Dose: 30 Gy
Reference Point Dosage Given to Date: 16 Gy
Reference Point Session Dosage Given: 2 Gy
Session Number: 33

## 2023-07-12 LAB — BASIC METABOLIC PANEL - CANCER CENTER ONLY
Anion gap: 6 (ref 5–15)
BUN: 14 mg/dL (ref 8–23)
CO2: 28 mmol/L (ref 22–32)
Calcium: 9.3 mg/dL (ref 8.9–10.3)
Chloride: 111 mmol/L (ref 98–111)
Creatinine: 0.98 mg/dL (ref 0.61–1.24)
GFR, Estimated: >60 mL/min (ref >60)
Glucose, Bld: 110 mg/dL — ABNORMAL HIGH (ref 70–99)
Potassium: 3.5 mmol/L (ref 3.5–5.1)
Sodium: 145 mmol/L (ref 135–145)

## 2023-07-12 MED ORDER — POTASSIUM CHLORIDE CRYS ER 10 MEQ PO TBCR: 20.0000 meq | EXTENDED_RELEASE_TABLET | Freq: Every day | ORAL | 2 refills | Status: DC

## 2023-07-12 NOTE — Assessment & Plan Note (Addendum)
 Patient has been on Lipitor.

## 2023-07-12 NOTE — Assessment & Plan Note (Addendum)
 Weakness, falls, hypokalemia required hospitalization.  Avoid AAP in the future ADT for 2-3 years if able to tolerate Follow-up with radiation oncology team as recommended Will obtain Dexa scan as baseline May continue follow up with Dr. Iantha for future ADT and PSA monitoring post-treatment

## 2023-07-12 NOTE — Assessment & Plan Note (Signed)
 Supportive calcium  (1000-1200 mg daily from food and supplements) and vitamin D3 (1000 IU daily) Dental care regularly Healthy diet to prevent diabetes Aggressive cardiovascular risk management Exercises as able (30 minutes per day) Limit alcohol consumption and avoid smoking

## 2023-07-12 NOTE — Assessment & Plan Note (Signed)
 Continue potassium 20 mEq nightly Refill today

## 2023-07-12 NOTE — Assessment & Plan Note (Addendum)
 On both valsartan  160 mg and amlodipine  10 mg Report BP 130-144 with both medications Continue medication.

## 2023-07-15 ENCOUNTER — Ambulatory Visit
Admission: RE | Admit: 2023-07-15 | Discharge: 2023-07-15 | Disposition: A | Discharge status: Home / Self Care | Source: Ambulatory Visit | Attending: Radiation Oncology

## 2023-07-15 ENCOUNTER — Other Ambulatory Visit: Payer: Self-pay

## 2023-07-15 DIAGNOSIS — Z510 Encounter for antineoplastic radiation therapy: Secondary | ICD-10-CM | POA: Diagnosis not present

## 2023-07-15 LAB — RAD ONC ARIA SESSION SUMMARY
Course Elapsed Days: 54
Plan Fractions Treated to Date: 9
Plan Prescribed Dose Per Fraction: 2 Gy
Plan Total Fractions Prescribed: 15
Plan Total Prescribed Dose: 30 Gy
Reference Point Dosage Given to Date: 18 Gy
Reference Point Session Dosage Given: 2 Gy
Session Number: 34

## 2023-07-16 ENCOUNTER — Ambulatory Visit
Admission: RE | Admit: 2023-07-16 | Discharge: 2023-07-16 | Disposition: A | Discharge status: Home / Self Care | Source: Ambulatory Visit | Attending: Radiation Oncology | Admitting: Radiation Oncology

## 2023-07-16 ENCOUNTER — Other Ambulatory Visit: Payer: Self-pay

## 2023-07-16 DIAGNOSIS — C61 Malignant neoplasm of prostate: Secondary | ICD-10-CM | POA: Diagnosis present

## 2023-07-16 DIAGNOSIS — Z510 Encounter for antineoplastic radiation therapy: Secondary | ICD-10-CM | POA: Diagnosis present

## 2023-07-16 LAB — RAD ONC ARIA SESSION SUMMARY
Course Elapsed Days: 55
Plan Fractions Treated to Date: 10
Plan Prescribed Dose Per Fraction: 2 Gy
Plan Total Fractions Prescribed: 15
Plan Total Prescribed Dose: 30 Gy
Reference Point Dosage Given to Date: 20 Gy
Reference Point Session Dosage Given: 2 Gy
Session Number: 35

## 2023-07-17 ENCOUNTER — Ambulatory Visit
Admission: RE | Admit: 2023-07-17 | Discharge: 2023-07-17 | Disposition: A | Discharge status: Home / Self Care | Source: Ambulatory Visit | Attending: Radiation Oncology | Admitting: Radiation Oncology

## 2023-07-17 ENCOUNTER — Other Ambulatory Visit: Payer: Self-pay

## 2023-07-17 ENCOUNTER — Ambulatory Visit

## 2023-07-17 DIAGNOSIS — Z510 Encounter for antineoplastic radiation therapy: Secondary | ICD-10-CM | POA: Diagnosis not present

## 2023-07-17 LAB — RAD ONC ARIA SESSION SUMMARY
Course Elapsed Days: 56
Plan Fractions Treated to Date: 11
Plan Prescribed Dose Per Fraction: 2 Gy
Plan Total Fractions Prescribed: 15
Plan Total Prescribed Dose: 30 Gy
Reference Point Dosage Given to Date: 22 Gy
Reference Point Session Dosage Given: 2 Gy
Session Number: 36

## 2023-07-18 ENCOUNTER — Other Ambulatory Visit: Payer: Self-pay

## 2023-07-18 ENCOUNTER — Ambulatory Visit
Admission: RE | Admit: 2023-07-18 | Discharge: 2023-07-18 | Disposition: A | Discharge status: Home / Self Care | Source: Ambulatory Visit | Attending: Radiation Oncology | Admitting: Radiation Oncology

## 2023-07-18 ENCOUNTER — Ambulatory Visit

## 2023-07-18 DIAGNOSIS — Z510 Encounter for antineoplastic radiation therapy: Secondary | ICD-10-CM | POA: Diagnosis not present

## 2023-07-18 LAB — RAD ONC ARIA SESSION SUMMARY
Course Elapsed Days: 57
Plan Fractions Treated to Date: 12
Plan Prescribed Dose Per Fraction: 2 Gy
Plan Total Fractions Prescribed: 15
Plan Total Prescribed Dose: 30 Gy
Reference Point Dosage Given to Date: 24 Gy
Reference Point Session Dosage Given: 2 Gy
Session Number: 37

## 2023-07-19 ENCOUNTER — Ambulatory Visit

## 2023-07-22 ENCOUNTER — Ambulatory Visit
Admission: RE | Admit: 2023-07-22 | Discharge: 2023-07-22 | Disposition: A | Discharge status: Home / Self Care | Source: Ambulatory Visit | Attending: Radiation Oncology | Admitting: Radiation Oncology

## 2023-07-22 ENCOUNTER — Ambulatory Visit

## 2023-07-22 ENCOUNTER — Other Ambulatory Visit: Payer: Self-pay

## 2023-07-22 DIAGNOSIS — Z510 Encounter for antineoplastic radiation therapy: Secondary | ICD-10-CM | POA: Diagnosis not present

## 2023-07-22 LAB — RAD ONC ARIA SESSION SUMMARY
Course Elapsed Days: 61
Plan Fractions Treated to Date: 13
Plan Prescribed Dose Per Fraction: 2 Gy
Plan Total Fractions Prescribed: 15
Plan Total Prescribed Dose: 30 Gy
Reference Point Dosage Given to Date: 26 Gy
Reference Point Session Dosage Given: 2 Gy
Session Number: 38

## 2023-07-23 ENCOUNTER — Ambulatory Visit
Admission: RE | Admit: 2023-07-23 | Discharge: 2023-07-23 | Disposition: A | Discharge status: Home / Self Care | Source: Ambulatory Visit | Attending: Radiation Oncology | Admitting: Radiation Oncology

## 2023-07-23 ENCOUNTER — Ambulatory Visit

## 2023-07-23 ENCOUNTER — Other Ambulatory Visit: Payer: Self-pay

## 2023-07-23 DIAGNOSIS — Z510 Encounter for antineoplastic radiation therapy: Secondary | ICD-10-CM | POA: Diagnosis not present

## 2023-07-23 LAB — RAD ONC ARIA SESSION SUMMARY
Course Elapsed Days: 62
Plan Fractions Treated to Date: 14
Plan Prescribed Dose Per Fraction: 2 Gy
Plan Total Fractions Prescribed: 15
Plan Total Prescribed Dose: 30 Gy
Reference Point Dosage Given to Date: 28 Gy
Reference Point Session Dosage Given: 2 Gy
Session Number: 39

## 2023-07-24 ENCOUNTER — Other Ambulatory Visit: Payer: Self-pay

## 2023-07-24 ENCOUNTER — Ambulatory Visit
Admission: RE | Admit: 2023-07-24 | Discharge: 2023-07-24 | Disposition: A | Discharge status: Home / Self Care | Source: Ambulatory Visit | Attending: Radiation Oncology | Admitting: Radiation Oncology

## 2023-07-24 DIAGNOSIS — C61 Malignant neoplasm of prostate: Secondary | ICD-10-CM

## 2023-07-24 DIAGNOSIS — Z510 Encounter for antineoplastic radiation therapy: Secondary | ICD-10-CM | POA: Diagnosis not present

## 2023-07-24 LAB — RAD ONC ARIA SESSION SUMMARY
Course Elapsed Days: 63
Plan Fractions Treated to Date: 15
Plan Prescribed Dose Per Fraction: 2 Gy
Plan Total Fractions Prescribed: 15
Plan Total Prescribed Dose: 30 Gy
Reference Point Dosage Given to Date: 30 Gy
Reference Point Session Dosage Given: 2 Gy
Session Number: 40

## 2023-07-26 NOTE — Radiation Completion Notes (Addendum)
  Radiation Oncology         [phone]) [phone] ________________________________  Name: Chase Cook MRN: [id number]  Date: 07/24/2023  DOB: [birth year]  Referring Physician: VICTOR SHOWALTER, M.D. Date of Service: 2023-07-26 Radiation Oncologist: Adina Barge, M.D. [facility]     RADIATION ONCOLOGY END OF TREATMENT NOTE     Diagnosis: 78 y.o. gentleman with locally advanced adenocarcinoma of the prostate with Gleason score of 4+5, PSA of 18.7 and a single pelvic nodal metastasis-  Stage IVA (cT1c, cN1, cM0, PSA: 18.7, Grade Group: 5)  Intent: Curative     ==========DELIVERED PLANS==========  First Treatment Date: 2023-05-22 Last Treatment Date: 2023-07-24   Plan Name: Prostate_Pelv Site: Prostate Technique: IMRT Mode: Photon Dose Per Fraction: 1.8 Gy Prescribed Dose (Delivered / Prescribed): 45 Gy / 45 Gy Prescribed Fxs (Delivered / Prescribed): 25 / 25   Plan Name: Prostate_Bst Site: Prostate Technique: IMRT Mode: Photon Dose Per Fraction: 2 Gy Prescribed Dose (Delivered / Prescribed): 30 Gy / 30 Gy Prescribed Fxs (Delivered / Prescribed): 15 / 15     ==========ON TREATMENT VISIT DATES========== 2023-05-23, 2023-06-04, 2023-06-07, 2023-06-14, 2023-06-21, 2023-06-28, 2023-07-05, 2023-07-15, 2023-07-23     See weekly On Treatment Notes in Epic for details in the Media tab (listed as Progress notes on the On Treatment Visit Dates listed above).  He tolerated the radiation treatments relatively well with increased LUTS and modest fatigue.  The patient will receive a call in about one month from the radiation oncology department. He will continue follow up with his urologist, Dr. Shane and medical oncologist, Dr. Tina, as well.  ------------------------------------------------   Donnice Barge, MD [facility]  Radiation Oncology Direct Dial: [phone]  Fax: [phone] [email or web address]  Skype  LinkedIn

## 2023-07-30 NOTE — Progress Notes (Addendum)
 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  Patient proceed with treatment recommendations of 8 weeks of external beam therapy concurrent with LT-ADT and therapy escalation with [facility]. Pt had his final radiation treatment on 07/24/23.   Patient is scheduled for a post treatment nurse call on 08/27/23 and has on going follow up's with urology.   Patient will receive additional ADT at [facility] on 9/16.    Patient therapy escalation was discontinued due to generalized weakness, fall, hypokalemia resulted in hospitalization, and significant HTN. At this time will remain on ADT.

## 2023-08-26 ENCOUNTER — Other Ambulatory Visit: Payer: Self-pay

## 2023-08-26 DIAGNOSIS — I159 Secondary hypertension, unspecified: Secondary | ICD-10-CM

## 2023-08-27 ENCOUNTER — Other Ambulatory Visit: Payer: Self-pay | Admitting: Urology

## 2023-08-27 ENCOUNTER — Ambulatory Visit
Admission: RE | Admit: 2023-08-27 | Discharge: 2023-08-27 | Disposition: A | Discharge status: Home / Self Care | Source: Ambulatory Visit

## 2023-08-27 DIAGNOSIS — C61 Malignant neoplasm of prostate: Secondary | ICD-10-CM

## 2023-08-27 DIAGNOSIS — Z510 Encounter for antineoplastic radiation therapy: Secondary | ICD-10-CM | POA: Insufficient documentation

## 2023-08-27 DIAGNOSIS — C775 Secondary and unspecified malignant neoplasm of intrapelvic lymph nodes: Secondary | ICD-10-CM

## 2023-08-27 NOTE — Addendum Note (Signed)
 Encounter addended by: Jamaria Amborn, PA-C on: 08/27/2023 3:52 PM  Actions taken: Clinical Note Signed, Visit diagnoses modified

## 2023-08-27 NOTE — Progress Notes (Signed)
  Radiation Oncology         [phone]) [phone] ________________________________  Name: Chase Cook MRN: [id number]  Date of Service: 08/27/2023  DOB: [birth year]  Post Treatment Telephone Note  Diagnosis:  Locally advanced adenocarcinoma of the prostate with Gleason Score of 4+5, PSA of 18.7 and a single pelvic nodal metastasis.    Pre Treatment IPSS Score: 12 (as documented in the provider consult note).  The patient was available for call today.   Symptoms of fatigue have not improved since completing therapy.  Report still a little sluggish but is still active.  Symptoms of bladder changes have not improved since completing therapy. Current symptoms include urinary frequency, weak urine stream, and mild dysuria, and medications for bladder symptoms include Tamsulosin.  Symptoms of bowel changes have improved since completing therapy. Current symptoms include reports gassy is able to have bowel movements without difficulty, and medications for bowel symptoms include Miralax powder takes as needed.   Post Treatment IPSS Score: IPSS Questionnaire (AUA-7): Over the past month.   1)  How often have you had a sensation of not emptying your bladder completely after you finish urinating?  0 - Not at all  2)  How often have you had to urinate again less than two hours after you finished urinating? 0 - Not at all  3)  How often have you found you stopped and started again several times when you urinated?  0 - Not at all  4) How difficult have you found it to postpone urination?  0 - Not at all  5) How often have you had a weak urinary stream?  4 - More than half the time  6) How often have you had to push or strain to begin urination?  0 - Not at all  7) How many times did you most typically get up to urinate from the time you went to bed until the time you got up in the morning?  4 - 4 times  Total score:  16. Which indicates moderate symptoms  0-7 mildly symptomatic   8-19 moderately  symptomatic   20-35 severely symptomatic   Patient has a scheduled follow up visit with his urologist, Dr. Steffan Pea, on 09/2023. He was counseled that PSA levels will be drawn in the urology office, and was reassured that additional time is expected to improve bowel and bladder symptoms. He was encouraged to call back with concerns or questions regarding radiation.

## 2023-09-05 ENCOUNTER — Ambulatory Visit (HOSPITAL_COMMUNITY)
Admission: RE | Admit: 2023-09-05 | Discharge: 2023-09-05 | Disposition: A | Discharge status: Home / Self Care | Source: Ambulatory Visit | Attending: Internal Medicine | Admitting: Internal Medicine

## 2023-09-05 ENCOUNTER — Encounter (HOSPITAL_COMMUNITY): Payer: Self-pay

## 2023-09-05 VITALS — BP 124/83 | HR 67 | Temp 98.4°F | Resp 18

## 2023-09-05 DIAGNOSIS — H6123 Impacted cerumen, bilateral: Secondary | ICD-10-CM | POA: Diagnosis not present

## 2023-09-05 NOTE — Discharge Instructions (Signed)
We flushed out the earwax from your ear(s) today.  Do not use Q-tips as this makes symptoms worse and pushes wax further into the ear canal.  You may use over-the-counter Debrox eardrops as needed for earwax removal at home as needed.  If you develop any new or worsening symptoms or if your symptoms do not start to improve, pleases return here or follow-up with your primary care provider. If your symptoms are severe, please go to the emergency room.

## 2023-09-05 NOTE — ED Provider Notes (Signed)
 [facility]    CSN: [id number] Arrival date & time: 09/05/23  1517      History   Chief Complaint Chief Complaint  Patient presents with   Appointment    HPI Chase Cook is a 78 y.o. male.   Chase Cook is a 78 y.o. male presenting for chief complaint of left ear fullness that started a few days ago.  He is having a difficult time hearing out of his left ear.  States he can hear fine out of his right ear.  He has not noticed any drainage from the ears, tinnitus, fever, chills, nausea, vomiting, sore throat, or congestion.  He believes he may have earwax impactions since this feels similar to the last time he needed to have his ears cleaned out.     Past Medical History:  Diagnosis Date   Cancer [facility])    Prostate   Elevated PSA    High cholesterol    Hypertension     Patient Active Problem List   Diagnosis Date Noted   Abnormal liver function 06/12/2023   Acute cystitis with hematuria 05/31/2023   Hypokalemia 05/31/2023   Current chronic use of systemic steroids 05/31/2023   UTI (urinary tract infection) 05/31/2023   Generalized weakness 05/30/2023   At risk for side effect of medication 03/21/2023   Hypertension    High cholesterol    Prostate cancer (HCC) 02/26/2023    Past Surgical History:  Procedure Laterality Date   GOLD SEED IMPLANT N/A 05/10/2023   Procedure: INSERTION, GOLD SEEDS;  Surgeon: Elisabeth Valli BIRCH, MD;  Location: [facility] OR;  Service: Urology;  Laterality: N/A;   PROSTATE BIOPSY     SPACE OAR INSTILLATION N/A 05/10/2023   Procedure: INJECTION, HYDROGEL SPACER;  Surgeon: Elisabeth Valli BIRCH, MD;  Location: [facility] OR;  Service: Urology;  Laterality: N/A;       Home Medications    Prior to Admission medications   Medication Sig Start Date End Date Taking? Authorizing Provider  amLODipine  (NORVASC ) 10 MG tablet TAKE 1 TABLET BY MOUTH EVERY DAY 08/27/23   Tina Pauletta BROCKS, MD  aspirin EC 81 MG tablet Take 81 mg by mouth daily. Swallow  whole.    [provider]  atorvastatin  (LIPITOR) 10 MG tablet Take 10 mg by mouth at bedtime.    [provider]  betamethasone dipropionate (DIPROLENE) 0.05 % ointment Apply 1 Application topically in the morning, at noon, and at bedtime.    [provider]  Calcium  Carb-Cholecalciferol  (CALCIUM  600 + D PO) Take 1 tablet by mouth in the morning and at bedtime.    [provider]  Cholecalciferol  (VITAMIN D ) 50 MCG (2000 UT) CAPS Take 2,000 Units by mouth daily.    [provider]  clotrimazole (LOTRIMIN) 1 % cream Apply 1 Application topically 2 (two) times daily as needed.    [provider]  loratadine (CLARITIN) 10 MG tablet Take 10 mg by mouth daily as needed for allergies.    [provider]  potassium chloride  (KLOR-CON  M) 10 MEQ tablet Take 2 tablets (20 mEq total) by mouth daily. 07/12/23   Tina Pauletta BROCKS, MD  sildenafil (VIAGRA) 100 MG tablet Take 100 mg by mouth daily as needed for erectile dysfunction.    [provider]  tamsulosin  (FLOMAX ) 0.4 MG CAPS capsule Take 0.4 mg by mouth daily.    [provider]  valsartan  (DIOVAN ) 160 MG tablet TAKE 1 TABLET (160 MG TOTAL) BY MOUTH DAILY.  TAKE ONE HALF TABLET FOR BLOOD PRESSURE. Patient taking differently: Take 80 mg by mouth daily. 04/15/23   Tina Pauletta BROCKS, MD    Family History Family History  Problem Relation Age of Onset   Diabetes Brother     Social History Social History   Tobacco Use   Smoking status: Never   Smokeless tobacco: Never  Vaping Use   Vaping status: Never Used  Substance Use Topics   Alcohol use: No   Drug use: No     Allergies   Lactose intolerance (gi)   Review of Systems Review of Systems Per HPI  Physical Exam Triage Vital Signs ED Triage Vitals  Encounter Vitals Group     BP 09/05/23 1546 124/83     Girls Systolic BP Percentile --      Girls Diastolic BP Percentile --      Boys Systolic BP Percentile --       Boys Diastolic BP Percentile --      Pulse Rate 09/05/23 1546 67     Resp 09/05/23 1546 18     Temp 09/05/23 1546 98.4 F (36.9 C)     Temp Source 09/05/23 1546 Oral     SpO2 09/05/23 1546 94 %     Weight --      Height --      Head Circumference --      Peak Flow --      Pain Score 09/05/23 1545 0     Pain Loc --      Pain Education --      Exclude from Growth Chart --    No data found.  Updated Vital Signs BP 124/83 (BP Location: Left Arm)   Pulse 67   Temp 98.4 F (36.9 C) (Oral)   Resp 18   SpO2 94%   Visual Acuity Right Eye Distance:   Left Eye Distance:   Bilateral Distance:    Right Eye Near:   Left Eye Near:    Bilateral Near:     Physical Exam Vitals and nursing note reviewed.  Constitutional:      Appearance: He is not ill-appearing or toxic-appearing.  HENT:     Head: Normocephalic and atraumatic.     Right Ear: Hearing, tympanic membrane, ear canal and external ear normal. There is impacted cerumen.     Left Ear: Hearing, tympanic membrane, ear canal and external ear normal. There is impacted cerumen.     Nose: Nose normal.     Mouth/Throat:     Lips: Pink.     Mouth: Mucous membranes are moist. No injury or oral lesions.     Dentition: Normal dentition.     Tongue: No lesions.     Pharynx: Oropharynx is clear. Uvula midline. No pharyngeal swelling, oropharyngeal exudate, posterior oropharyngeal erythema, uvula swelling or postnasal drip.     Tonsils: No tonsillar exudate.  Eyes:     General: Lids are normal. Vision grossly intact. Gaze aligned appropriately.     Extraocular Movements: Extraocular movements intact.     Conjunctiva/sclera: Conjunctivae normal.  Neck:     Trachea: Trachea and phonation normal.  Pulmonary:     Effort: Pulmonary effort is normal.  Musculoskeletal:     Cervical back: Neck supple.  Lymphadenopathy:     Cervical: No cervical adenopathy.  Skin:    General: Skin is warm and dry.     Capillary Refill: Capillary  refill takes less than 2 seconds.     Findings: No  rash.  Neurological:     General: No focal deficit present.     Mental Status: He is alert and oriented to person, place, and time. Mental status is at baseline.     Cranial Nerves: No dysarthria or facial asymmetry.  Psychiatric:        Mood and Affect: Mood normal.        Speech: Speech normal.        Behavior: Behavior normal.        Thought Content: Thought content normal.        Judgment: Judgment normal.      UC Treatments / Results  Labs (all labs ordered are listed, but only abnormal results are displayed) Labs Reviewed - No data to display  EKG   Radiology No results found.  Procedures Procedures (including critical care time)  Medications Ordered in UC Medications - No data to display  Initial Impression / Assessment and Plan / UC Course  I have reviewed the triage vital signs and the nursing notes.  Pertinent labs & imaging results that were available during my care of the patient were reviewed by me and considered in my medical decision making (see chart for details).   1. Bilateral impacted cerumen Both ear(s) cleaned with ear lavage to remove ear wax impactions bilaterally by nursing staff.  Reassessment shows normal TM bilaterally without signs of AOM/AOE.  Patient may use debrox ear drops at home as needed for wax removal and has been advised to avoid using Q-tips.   Counseled patient on potential for adverse effects with medications prescribed/recommended today, strict ER and return-to-clinic precautions discussed, patient verbalized understanding.    Final Clinical Impressions(s) / UC Diagnoses   Final diagnoses:  Bilateral impacted cerumen     Discharge Instructions      We flushed out the earwax from your ear(s) today.  Do not use Q-tips as this makes symptoms worse and pushes wax further into the ear canal.  You may use over-the-counter Debrox eardrops as needed for earwax removal at  home as needed.  If you develop any new or worsening symptoms or if your symptoms do not start to improve, pleases return here or follow-up with your primary care provider. If your symptoms are severe, please go to the emergency room.     ED Prescriptions   None    PDMP not reviewed this encounter.   Enedelia Dorna HERO, [place] 09/05/23 1649

## 2023-09-05 NOTE — ED Triage Notes (Signed)
 Pt reports left ear started having fullness and having hard time hearing out of it. Denies pain but reports has constant ringing in his ear.   Pt finished radiation July for prostate cancer.

## 2023-09-28 DIAGNOSIS — M85851 Other specified disorders of bone density and structure, right thigh: Secondary | ICD-10-CM | POA: Insufficient documentation

## 2023-09-28 NOTE — Progress Notes (Signed)
 [facility] OFFICE PROGRESS NOTE  Patient Care Team: Godfrey Area, MD as PCP - General (Internal Medicine) Vertell Pont, RN as Oncology Nurse Navigator  Chase Cook is a 78 y.o.male with history of hypertension, hyperlipidemia being [facility].   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   Germline testing: not yet Somatic testing: not yet Treatment:  8 weeks of external beam therapy concurrent with LT-ADT. Will finish on 7/8. 04/13/23 started AAP Presented with generalized weakness, fall resulted in hospitalization therefore elected to discontinue AAP.  5/7-07/24/23 IMRT to Prostate  Osteopenia of R femoral neck. Will need BMA. Assessment & Plan Prostate cancer [facility]) Completed radiation on 07/24/23 ADT for 2-3 years if able to tolerate Osteopenia on Dexa. Recommend [facility] May continue follow up with Dr. Iantha for future ADT and PSA monitoring post-treatment Hypokalemia stop potassium  Repeat lab in about 4 weeks Follow up in 4 weeks. Osteopenia of right femoral neck Daily calcium  and vitamin D  Physical activities and exercises Will communicate with Urology on [facility]  Orders Placed This Encounter  Procedures   Basic Metabolic Panel - Cancer Center Only    Standing Status:   Future    Expiration Date:   09/29/2024     [name], MD  INTERVAL HISTORY: Patient returns for follow-up. He is getting injection at [facility]. No chest pain, short of breath. He walks about a block and has to stop and sit down. No diarrhea.  Take vit D and Calcium . Report no concerns from dentist. Denies need for any invasive procedure from dentist and has 4 month follow up.  Oncology History  Prostate cancer [facility])  01/04/2023 Pathology Results   MRI fusion biopsy  14/15 core biopsies positive.  The maximum Gleason score was 4+5 and multiple 4+4, and  4+3 prostate adenocarcinoma  Pretreatment PSA 18.7   02/04/2023 PET scan   PSMA PET 1. Intense radiotracer activity within the prostate gland consistent primary prostate adenocarcinoma. 2. Metastatic lymph node posterior RIGHT of the rectum as described above. Lymph nodes: Radiotracer avid lymph node in the mesorectal sheath posterior RIGHT of the rectum measuring 8 mm on image 168 with SUV max equal 11.7. 3. No evidence of metastatic adenopathy outside the pelvis. 4. No visceral metastasis or skeletal metastasis.   02/25/2023 -  Chemotherapy   started on ADT with Firmagon on 02/25/23.    02/26/2023 Initial Diagnosis   Malignant neoplasm of prostate (HCC)   02/26/2023 Cancer Staging   Staging form: Prostate, AJCC 8th Edition - Clinical stage from 02/26/2023: Stage IVA (cT1c, cN1, cM0, PSA: 18.7, Grade Group: 5) - Signed by Patrcia Cough, MD on 02/26/2023 Histopathologic type: Adenocarcinoma, NOS Stage prefix: Initial diagnosis Prostate specific antigen (PSA) range: 10 to 19 Gleason primary pattern: 4 Gleason secondary pattern: 5 Gleason score: 9 Histologic grading system: 5 grade system   05/22/2023 - 07/24/2023 Radiation Therapy   IMRT to Prostate 45 Gy in 25 fractions of 1.8 Gy, followed by a boost to the prostate and PET-Positive node to a total dose of 75 Gy with 15 additional fractions of 2 Gy.    07/17/2023 Tumor Marker   PSA 0.15      PHYSICAL EXAMINATION: ECOG PERFORMANCE STATUS: 2 - Symptomatic, <50% confined to bed  Vitals:   09/30/23 0919  BP: 135/63  Pulse: 65  Resp: 20  Temp: (!) 97.3 F (36.3  C)  SpO2: 97%   Filed Weights   09/30/23 0919  Weight: 232 lb 9.6 oz (105.5 kg)    GENERAL: alert, no distress and comfortable LUNGS: clear to auscultation and percussion with normal breathing effort HEART: regular rate & rhythm  ABDOMEN: abdomen soft, non-tender and nondistended. Musculoskeletal: no edema   Relevant data reviewed during this visit included  labs.

## 2023-09-28 NOTE — Assessment & Plan Note (Addendum)
 Daily calcium  and vitamin D  Physical activities and exercises Will communicate with Urology on [facility]

## 2023-09-28 NOTE — Assessment & Plan Note (Addendum)
 stop potassium  Repeat lab in about 4 weeks Follow up in 4 weeks.

## 2023-09-28 NOTE — Assessment & Plan Note (Addendum)
 Completed radiation on 07/24/23 ADT for 2-3 years if able to tolerate Osteopenia on Dexa. Recommend [facility] May continue follow up with Dr. Iantha for future ADT and PSA monitoring post-treatment

## 2023-09-30 ENCOUNTER — Inpatient Hospital Stay

## 2023-09-30 VITALS — BP 135/63 | HR 65 | Temp 97.3°F | Resp 20 | Wt 232.6 lb

## 2023-09-30 DIAGNOSIS — M85851 Other specified disorders of bone density and structure, right thigh: Secondary | ICD-10-CM

## 2023-09-30 DIAGNOSIS — E876 Hypokalemia: Secondary | ICD-10-CM | POA: Diagnosis not present

## 2023-09-30 DIAGNOSIS — C61 Malignant neoplasm of prostate: Secondary | ICD-10-CM | POA: Insufficient documentation

## 2023-09-30 DIAGNOSIS — Z923 Personal history of irradiation: Secondary | ICD-10-CM | POA: Insufficient documentation

## 2023-09-30 DIAGNOSIS — C775 Secondary and unspecified malignant neoplasm of intrapelvic lymph nodes: Secondary | ICD-10-CM | POA: Diagnosis present

## 2023-09-30 LAB — CMP (CANCER CENTER ONLY)
ALT: 9 U/L (ref 0–44)
AST: 10 U/L — ABNORMAL LOW (ref 15–41)
Albumin: 3.8 g/dL (ref 3.5–5.0)
Alkaline Phosphatase: 47 U/L (ref 38–126)
Anion gap: 5 (ref 5–15)
BUN: 16 mg/dL (ref 8–23)
CO2: 29 mmol/L (ref 22–32)
Calcium: 9.6 mg/dL (ref 8.9–10.3)
Chloride: 110 mmol/L (ref 98–111)
Creatinine: 1.01 mg/dL (ref 0.61–1.24)
GFR, Estimated: >60 mL/min (ref >60)
Glucose, Bld: 100 mg/dL — ABNORMAL HIGH (ref 70–99)
Potassium: 3.8 mmol/L (ref 3.5–5.1)
Sodium: 144 mmol/L (ref 135–145)
Total Bilirubin: 0.3 mg/dL (ref 0.0–1.2)
Total Protein: 6.6 g/dL (ref 6.5–8.1)

## 2023-09-30 LAB — CBC WITH DIFFERENTIAL (CANCER CENTER ONLY)
Abs Immature Granulocytes: 0.02 K/uL (ref 0.00–0.07)
Basophils Absolute: 0 K/uL (ref 0.0–0.1)
Basophils Relative: 1 %
Eosinophils Absolute: 0.2 K/uL (ref 0.0–0.5)
Eosinophils Relative: 4 %
HCT: 40.5 % (ref 39.0–52.0)
Hemoglobin: 13.1 g/dL (ref 13.0–17.0)
Immature Granulocytes: 0 %
Lymphocytes Relative: 25 %
Lymphs Abs: 1.2 K/uL (ref 0.7–4.0)
MCH: 29.5 pg (ref 26.0–34.0)
MCHC: 32.3 g/dL (ref 30.0–36.0)
MCV: 91.2 fL (ref 80.0–100.0)
Monocytes Absolute: 0.5 K/uL (ref 0.1–1.0)
Monocytes Relative: 10 %
Neutro Abs: 3 K/uL (ref 1.7–7.7)
Neutrophils Relative %: 60 %
Platelet Count: 167 K/uL (ref 150–400)
RBC: 4.44 MIL/uL (ref 4.22–5.81)
RDW: 12.5 % (ref 11.5–15.5)
WBC Count: 4.9 K/uL (ref 4.0–10.5)
nRBC: 0 % (ref 0.0–0.2)

## 2023-10-01 NOTE — Progress Notes (Signed)
 Patient received Eligard 45mg  from [facility] on 9/16.

## 2023-10-27 NOTE — Assessment & Plan Note (Addendum)
 Potassium was stopped 4 weeks ago. Repeat lab showed   Follow up in 4 weeks.

## 2023-10-27 NOTE — Assessment & Plan Note (Addendum)
 Completed radiation on 07/24/23 ADT for 2-3 years if able to tolerate. Patient received Eligard 45mg  from [facility] on 9/16  Osteopenia on Dexa. Recommend [facility] May continue follow up with Dr. Iantha for future ADT and PSA monitoring post-treatment. Will see him this week

## 2023-10-27 NOTE — Progress Notes (Unsigned)
 [facility] OFFICE PROGRESS NOTE  Patient Care Team: Godfrey Area, MD as PCP - General (Internal Medicine) Vertell Pont, RN as Oncology Nurse Navigator  Chase Cook is a 78 y.o.male with history of hypertension, hyperlipidemia being [facility] cancer.    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  Germline testing: not yet Somatic testing: not yet Treatment:  8 weeks of external beam therapy concurrent with LT-ADT. Will finish on 7/8. 04/13/23 started AAP Presented with generalized weakness, fall resulted in hospitalization therefore elected to discontinue AAP.  5/7-07/24/23 IMRT to Prostate   Osteopenia of R femoral neck. Will need BMA.   Assessment & Plan Prostate cancer [facility]) Completed radiation on 07/24/23 ADT for 2-3 years if able to tolerate. Patient received Eligard 45mg  from [facility] on 9/16  Osteopenia on Dexa. Recommend [facility] May continue follow up with Dr. Iantha for future ADT and PSA monitoring post-treatment Hypokalemia Potassium was stopped 4 weeks ago. Repeat lab showed   Follow up in 4 weeks. Osteopenia of right femoral neck Daily calcium  and vitamin D  Physical activities and exercises Start zometa after dental clearance  No orders of the defined types were placed in this encounter.    Chase JAYSON Chihuahua, MD  INTERVAL HISTORY: Patient returns for follow-up.  Oncology History  Prostate cancer [facility])  01/04/2023 Pathology Results   MRI fusion biopsy  14/15 core biopsies positive.  The maximum Gleason score was 4+5 and multiple 4+4, and 4+3 prostate adenocarcinoma  Pretreatment PSA 18.7   02/04/2023 PET scan   PSMA PET 1. Intense radiotracer activity within the prostate gland consistent primary prostate adenocarcinoma. 2. Metastatic lymph node posterior RIGHT of the rectum as described above. Lymph nodes: Radiotracer  avid lymph node in the mesorectal sheath posterior RIGHT of the rectum measuring 8 mm on image 168 with SUV max equal 11.7. 3. No evidence of metastatic adenopathy outside the pelvis. 4. No visceral metastasis or skeletal metastasis.   02/25/2023 -  Chemotherapy   started on ADT with Firmagon on 02/25/23.    02/26/2023 Initial Diagnosis   Malignant neoplasm of prostate (HCC)   02/26/2023 Cancer Staging   Staging form: Prostate, AJCC 8th Edition - Clinical stage from 02/26/2023: Stage IVA (cT1c, cN1, cM0, PSA: 18.7, Grade Group: 5) - Signed by Patrcia Cough, MD on 02/26/2023 Histopathologic type: Adenocarcinoma, NOS Stage prefix: Initial diagnosis Prostate specific antigen (PSA) range: 10 to 19 Gleason primary pattern: 4 Gleason secondary pattern: 5 Gleason score: 9 Histologic grading system: 5 grade system   05/22/2023 - 07/24/2023 Radiation Therapy   IMRT to Prostate 45 Gy in 25 fractions of 1.8 Gy, followed by a boost to the prostate and PET-Positive node to a total dose of 75 Gy with 15 additional fractions of 2 Gy.    07/17/2023 Tumor Marker   PSA 0.15      PHYSICAL EXAMINATION: ECOG PERFORMANCE STATUS: {CHL ONC ECOG PS:[phone]}  There were no vitals filed for this visit. There were no vitals filed for this visit.  GENERAL: alert, no distress and comfortable SKIN: skin color normal and no jaundice or bruising or petechiae on exposed skin EYES: normal, sclera clear OROPHARYNX: no exudate  NECK: No palpable mass LYMPH:  no palpable cervical, axillary lymphadenopathy  LUNGS: clear to auscultation and no wheeze or rales with normal breathing effort HEART: regular rate & rhythm  ABDOMEN: abdomen soft, non-tender and nondistended.  Musculoskeletal: no edema NEURO: no focal motor/sensory deficits  Relevant data reviewed during this visit included labs.  New labs ordered.

## 2023-10-27 NOTE — Assessment & Plan Note (Addendum)
 Daily calcium  and vitamin D  Seeing dentist every 4 months Physical activities and exercises Start zometa after dental clearance

## 2023-10-28 ENCOUNTER — Inpatient Hospital Stay

## 2023-10-28 ENCOUNTER — Inpatient Hospital Stay (HOSPITAL_BASED_OUTPATIENT_CLINIC_OR_DEPARTMENT_OTHER)

## 2023-10-28 VITALS — BP 133/72 | HR 69 | Temp 97.5°F | Resp 17 | Ht 66.0 in | Wt 235.0 lb

## 2023-10-28 DIAGNOSIS — C61 Malignant neoplasm of prostate: Secondary | ICD-10-CM | POA: Insufficient documentation

## 2023-10-28 DIAGNOSIS — C775 Secondary and unspecified malignant neoplasm of intrapelvic lymph nodes: Secondary | ICD-10-CM | POA: Diagnosis present

## 2023-10-28 DIAGNOSIS — E7800 Pure hypercholesterolemia, unspecified: Secondary | ICD-10-CM | POA: Diagnosis not present

## 2023-10-28 DIAGNOSIS — E876 Hypokalemia: Secondary | ICD-10-CM | POA: Insufficient documentation

## 2023-10-28 DIAGNOSIS — M85851 Other specified disorders of bone density and structure, right thigh: Secondary | ICD-10-CM

## 2023-10-28 DIAGNOSIS — Z923 Personal history of irradiation: Secondary | ICD-10-CM | POA: Insufficient documentation

## 2023-10-28 LAB — BASIC METABOLIC PANEL - CANCER CENTER ONLY
Anion gap: 4 — ABNORMAL LOW (ref 5–15)
BUN: 16 mg/dL (ref 8–23)
CO2: 31 mmol/L (ref 22–32)
Calcium: 9.5 mg/dL (ref 8.9–10.3)
Chloride: 110 mmol/L (ref 98–111)
Creatinine: 0.91 mg/dL (ref 0.61–1.24)
GFR, Estimated: >60 mL/min (ref >60)
Glucose, Bld: 103 mg/dL — ABNORMAL HIGH (ref 70–99)
Potassium: 3.4 mmol/L — ABNORMAL LOW (ref 3.5–5.1)
Sodium: 145 mmol/L (ref 135–145)

## 2023-10-28 NOTE — Assessment & Plan Note (Addendum)
 Patient has been on Lipitor.

## 2023-10-29 ENCOUNTER — Other Ambulatory Visit: Payer: Self-pay

## 2023-10-29 DIAGNOSIS — E876 Hypokalemia: Secondary | ICD-10-CM

## 2023-11-21 ENCOUNTER — Other Ambulatory Visit: Payer: Self-pay | Admitting: *Deleted

## 2023-11-21 DIAGNOSIS — C61 Malignant neoplasm of prostate: Secondary | ICD-10-CM

## 2023-11-23 ENCOUNTER — Other Ambulatory Visit: Payer: Self-pay

## 2023-11-23 DIAGNOSIS — I159 Secondary hypertension, unspecified: Secondary | ICD-10-CM

## 2023-11-24 NOTE — Progress Notes (Deleted)
 [facility] OFFICE PROGRESS NOTE  Patient Care Team: Godfrey Area, MD as PCP - General (Internal Medicine) Vertell Pont, RN as Oncology Nurse Navigator  Assessment & Plan Prostate cancer [facility]) Completed radiation on 07/24/23 ADT for 2-3 years if able to tolerate. Patient received Eligard 45mg  from [facility] on 9/16  Osteopenia on Dexa. Recommend [facility] May continue follow up with Dr. Iantha for future ADT and PSA monitoring post-treatment. Will see him this week Hypokalemia Repeat lab showed  Follow up in 4 weeks. Osteopenia of right femoral neck Daily calcium  and vitamin D  Seeing dentist every 4 months Physical activities and exercises Start zometa after dental clearance  No orders of the defined types were placed in this encounter.    [name], MD  INTERVAL HISTORY: Patient returns for follow-up.  Oncology History  Prostate cancer [facility])  01/04/2023 Pathology Results   MRI fusion biopsy  14/15 core biopsies positive.  The maximum Gleason score was 4+5 and multiple 4+4, and 4+3 prostate adenocarcinoma  Pretreatment PSA 18.7   02/04/2023 PET scan   PSMA PET 1. Intense radiotracer activity within the prostate gland consistent primary prostate adenocarcinoma. 2. Metastatic lymph node posterior RIGHT of the rectum as described above. Lymph nodes: Radiotracer avid lymph node in the mesorectal sheath posterior RIGHT of the rectum measuring 8 mm on image 168 with SUV max equal 11.7. 3. No evidence of metastatic adenopathy outside the pelvis. 4. No visceral metastasis or skeletal metastasis.   02/25/2023 -  Chemotherapy   started on ADT with Firmagon on 02/25/23.    02/26/2023 Initial Diagnosis   Malignant neoplasm of prostate (HCC)   02/26/2023 Cancer Staging   Staging form: Prostate, AJCC 8th Edition - Clinical stage from 02/26/2023: Stage IVA (cT1c, cN1, cM0, PSA: 18.7, Grade Group: 5) - Signed by Patrcia Cough, MD on 02/26/2023 Histopathologic  type: Adenocarcinoma, NOS Stage prefix: Initial diagnosis Prostate specific antigen (PSA) range: 10 to 19 Gleason primary pattern: 4 Gleason secondary pattern: 5 Gleason score: 9 Histologic grading system: 5 grade system   05/22/2023 - 07/24/2023 Radiation Therapy   IMRT to Prostate 45 Gy in 25 fractions of 1.8 Gy, followed by a boost to the prostate and PET-Positive node to a total dose of 75 Gy with 15 additional fractions of 2 Gy.    07/17/2023 Tumor Marker   PSA 0.15      PHYSICAL EXAMINATION: ECOG PERFORMANCE STATUS: {CHL ONC ECOG [phone]}  There were no vitals filed for this visit. There were no vitals filed for this visit.  GENERAL: alert, no distress and comfortable SKIN: skin color normal and no jaundice or bruising or petechiae on exposed skin EYES: normal, sclera clear OROPHARYNX: no exudate  NECK: No palpable mass LYMPH:  no palpable cervical, axillary lymphadenopathy  LUNGS: clear to auscultation and no wheeze or rales with normal breathing effort HEART: regular rate & rhythm  ABDOMEN: abdomen soft, non-tender and nondistended. Musculoskeletal: no edema NEURO: no focal motor/sensory deficits  Relevant data reviewed during this visit included labs.  New labs ordered.

## 2023-11-24 NOTE — Assessment & Plan Note (Deleted)
 Completed radiation on 07/24/23 ADT for 2-3 years if able to tolerate. Patient received Eligard 45mg  from [facility] on 9/16  Osteopenia on Dexa. Recommend [facility] May continue follow up with Dr. Iantha for future ADT and PSA monitoring post-treatment. Will see him this week

## 2023-11-24 NOTE — Assessment & Plan Note (Deleted)
 Repeat lab showed  Follow up in 4 weeks.

## 2023-11-24 NOTE — Assessment & Plan Note (Deleted)
 Daily calcium  and vitamin D  Seeing dentist every 4 months Physical activities and exercises Start zometa after dental clearance

## 2023-11-25 ENCOUNTER — Inpatient Hospital Stay

## 2023-11-25 ENCOUNTER — Telehealth: Payer: Self-pay

## 2023-11-25 DIAGNOSIS — C61 Malignant neoplasm of prostate: Secondary | ICD-10-CM

## 2023-11-25 DIAGNOSIS — E876 Hypokalemia: Secondary | ICD-10-CM

## 2023-11-25 DIAGNOSIS — M85851 Other specified disorders of bone density and structure, right thigh: Secondary | ICD-10-CM

## 2023-11-25 NOTE — Telephone Encounter (Signed)
 I rescheduled the patients appointment for tomorrow and called the patient to let him know. He is aware and will be here tomorrow afternoon.

## 2023-11-26 ENCOUNTER — Inpatient Hospital Stay

## 2023-11-26 VITALS — BP 156/82 | HR 66 | Temp 98.1°F | Resp 13 | Wt 238.0 lb

## 2023-11-26 DIAGNOSIS — C61 Malignant neoplasm of prostate: Secondary | ICD-10-CM | POA: Insufficient documentation

## 2023-11-26 DIAGNOSIS — E876 Hypokalemia: Secondary | ICD-10-CM | POA: Insufficient documentation

## 2023-11-26 DIAGNOSIS — C775 Secondary and unspecified malignant neoplasm of intrapelvic lymph nodes: Secondary | ICD-10-CM | POA: Insufficient documentation

## 2023-11-26 DIAGNOSIS — Z923 Personal history of irradiation: Secondary | ICD-10-CM | POA: Insufficient documentation

## 2023-11-26 DIAGNOSIS — M85851 Other specified disorders of bone density and structure, right thigh: Secondary | ICD-10-CM

## 2023-11-26 LAB — BASIC METABOLIC PANEL - CANCER CENTER ONLY
Anion gap: 5 (ref 5–15)
BUN: 17 mg/dL (ref 8–23)
CO2: 30 mmol/L (ref 22–32)
Calcium: 9.4 mg/dL (ref 8.9–10.3)
Chloride: 110 mmol/L (ref 98–111)
Creatinine: 0.93 mg/dL (ref 0.61–1.24)
GFR, Estimated: >60 mL/min (ref >60)
Glucose, Bld: 99 mg/dL (ref 70–99)
Potassium: 3.5 mmol/L (ref 3.5–5.1)
Sodium: 145 mmol/L (ref 135–145)

## 2023-11-26 NOTE — Assessment & Plan Note (Addendum)
 Repeat lab showed normalized without replacement

## 2023-11-26 NOTE — Assessment & Plan Note (Addendum)
 Completed radiation on 07/24/23 ADT for 2-3 years if able to tolerate. Patient received Eligard 45mg  from [facility] on 9/16  Osteopenia on Dexa. Recommend [facility] May continue follow up with Dr. Iantha for future ADT and PSA monitoring post-treatment. Will see him this week

## 2023-11-26 NOTE — Progress Notes (Signed)
 [facility] OFFICE PROGRESS NOTE  Patient Care Team: Godfrey Area, MD as PCP - General (Internal Medicine) Vertell Pont, RN as Oncology Nurse Navigator  Chase Cook is a 78 y.o.male with history of hypertension, hyperlipidemia [facility].    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  Germline testing: not yet Somatic testing: not yet Treatment:  8 weeks of external beam therapy concurrent with LT-ADT. Will finish on 7/8. 04/13/23 started [facility]AP Presented with generalized weakness, fall resulted in hospitalization therefore elected to discontinue AAP.  5/7-07/24/23 IMRT to Prostate  Potassium normalized without replacement.   Discussed zometa. Osteopenia of R femoral neck. He saw dentist and faxed the letter back. Report he has a partial denture in 2 weeks. No other procedures needs. Will start zometa next months. Assessment & Plan Osteopenia of right femoral neck Daily calcium  and vitamin D  Continue to see dentist every 4 months Physical activities and exercises Start zometa in about mid Dec Return in [facility] June with lab, visit and zometa Hypokalemia Repeat lab showed normalized without replacement Prostate cancer (HCC) Completed radiation on 07/24/23 ADT for 2-3 years if able to tolerate. Patient received Eligard 45mg  from [facility] on 9/16  Osteopenia on Dexa. Recommend [facility] May continue follow up with Dr. Iantha for future ADT and PSA monitoring post-treatment. Will see him this week  Will obtain PSA from outside records.    Chase JAYSON Chihuahua, MD  INTERVAL HISTORY: Patient returns for follow-up. Overall feeling well. No diarrhea. No other changes.  Oncology History  Prostate cancer [facility])  01/04/2023 Pathology Results   MRI fusion biopsy  14/15 core biopsies positive.  The maximum Gleason score was 4+5 and multiple 4+4, and 4+3  prostate adenocarcinoma  Pretreatment PSA 18.7   02/04/2023 PET scan   PSMA PET 1. Intense radiotracer activity within the prostate gland consistent primary prostate adenocarcinoma. 2. Metastatic lymph node posterior RIGHT of the rectum as described above. Lymph nodes: Radiotracer avid lymph node in the mesorectal sheath posterior RIGHT of the rectum measuring 8 mm on image 168 with SUV max equal 11.7. 3. No evidence of metastatic adenopathy outside the pelvis. 4. No visceral metastasis or skeletal metastasis.   02/25/2023 -  Chemotherapy   started on ADT with Firmagon on 02/25/23.    02/26/2023 Initial Diagnosis   Malignant neoplasm of prostate (HCC)   02/26/2023 Cancer Staging   Staging form: Prostate, AJCC 8th Edition - Clinical stage from 02/26/2023: Stage IVA (cT1c, cN1, cM0, PSA: 18.7, Grade Group: 5) - Signed by Patrcia Cough, MD on 02/26/2023 Histopathologic type: Adenocarcinoma, NOS Stage prefix: Initial diagnosis Prostate specific antigen (PSA) range: 10 to 19 Gleason primary pattern: 4 Gleason secondary pattern: 5 Gleason score: 9 Histologic grading system: 5 grade system   05/22/2023 - 07/24/2023 Radiation Therapy   IMRT to Prostate 45 Gy in 25 fractions of 1.8 Gy, followed by a boost to the prostate and PET-Positive node to a total dose of 75 Gy with 15 additional fractions of 2 Gy.    07/17/2023 Tumor Marker   PSA 0.15      PHYSICAL EXAMINATION: ECOG PERFORMANCE STATUS: 2  Vitals:   11/26/23 1450  BP: (!) 156/82  Pulse: 66  Resp: 13  Temp: 98.1 F (36.7 C)  SpO2: 98%   Filed Weights   11/26/23 1450  Weight: 238 lb (108 kg)    GENERAL:  alert, no distress and comfortable OROPHARYNX: partial denture. No signs of infection.   Relevant data reviewed during this visit included labs.  New labs ordered.

## 2023-11-26 NOTE — Assessment & Plan Note (Addendum)
 Daily calcium  and vitamin D  Continue to see dentist every 4 months Physical activities and exercises Start zometa in about mid Dec Return in [facility] June with lab, visit and zometa

## 2023-12-16 ENCOUNTER — Encounter: Payer: Self-pay | Admitting: *Deleted

## 2023-12-19 ENCOUNTER — Encounter: Payer: Self-pay | Admitting: *Deleted

## 2024-01-03 ENCOUNTER — Inpatient Hospital Stay

## 2024-01-03 VITALS — BP 131/67 | HR 62 | Temp 98.7°F | Resp 16 | Ht 66.0 in | Wt 228.0 lb

## 2024-01-03 DIAGNOSIS — C61 Malignant neoplasm of prostate: Secondary | ICD-10-CM | POA: Insufficient documentation

## 2024-01-03 DIAGNOSIS — E876 Hypokalemia: Secondary | ICD-10-CM

## 2024-01-03 DIAGNOSIS — M85851 Other specified disorders of bone density and structure, right thigh: Secondary | ICD-10-CM

## 2024-01-03 DIAGNOSIS — C775 Secondary and unspecified malignant neoplasm of intrapelvic lymph nodes: Secondary | ICD-10-CM | POA: Insufficient documentation

## 2024-01-03 LAB — BASIC METABOLIC PANEL - CANCER CENTER ONLY
Anion gap: 10 (ref 5–15)
BUN: 19 mg/dL (ref 8–23)
CO2: 26 mmol/L (ref 22–32)
Calcium: 9.7 mg/dL (ref 8.9–10.3)
Chloride: 108 mmol/L (ref 98–111)
Creatinine: 1.03 mg/dL (ref 0.61–1.24)
GFR, Estimated: >60 mL/min
Glucose, Bld: 109 mg/dL — ABNORMAL HIGH (ref 70–99)
Potassium: 3.9 mmol/L (ref 3.5–5.1)
Sodium: 144 mmol/L (ref 135–145)

## 2024-01-03 MED ORDER — SODIUM CHLORIDE 0.9 % IV SOLN: INTRAVENOUS | Status: DC

## 2024-01-03 MED ORDER — ZOLEDRONIC ACID 4 MG/100ML IV SOLN
4.0000 mg | Freq: Once | INTRAVENOUS | Status: AC
  Administered 2024-01-03: 4 mg via INTRAVENOUS
  Filled 2024-01-03: qty 100

## 2024-01-03 NOTE — Patient Instructions (Signed)

## 2024-01-21 ENCOUNTER — Encounter: Payer: Self-pay | Admitting: *Deleted

## 2024-01-21 NOTE — Progress Notes (Signed)
 SCP mailed to pt.

## 2024-01-28 ENCOUNTER — Encounter: Payer: Self-pay | Admitting: *Deleted

## 2024-01-28 ENCOUNTER — Inpatient Hospital Stay: Admitting: *Deleted

## 2024-01-28 DIAGNOSIS — C61 Malignant neoplasm of prostate: Secondary | ICD-10-CM

## 2024-01-28 NOTE — Progress Notes (Signed)
 SCP reviewed and completed. A copy sent to PCP. NKDA. Medications reviewed and updated. Pt denies smoking nor drinking. Pt is being treated for a recent UTI and has some discomfort to pelvic area.Rates a 4/10.Pt admits fatigue at times  and takes frequent breaks when doing tasks. Pt said hot flashes have been manageable He enjoys bowling with the senior league once a week. Last colonoscopy was 05/2014. Repeat 05/2024. Last bone density was 06/18/2023.Most recent PSA was < 0.1 on 10/22/2023.

## 2024-07-03 ENCOUNTER — Inpatient Hospital Stay
